# Patient Record
Sex: Female | Born: 1935 | Race: White | Hispanic: No | Smoking: Never smoker
Health system: Southern US, Community
[De-identification: ages and names within clinical notes are randomized; demographics above are authoritative.]

## PROBLEM LIST (undated history)

## (undated) DIAGNOSIS — G20 Parkinson's disease: Secondary | ICD-10-CM

## (undated) DIAGNOSIS — K219 Gastro-esophageal reflux disease without esophagitis: Secondary | ICD-10-CM

## (undated) DIAGNOSIS — K146 Glossodynia: Secondary | ICD-10-CM

## (undated) DIAGNOSIS — K3184 Gastroparesis: Secondary | ICD-10-CM

## (undated) DIAGNOSIS — I639 Cerebral infarction, unspecified: Secondary | ICD-10-CM

## (undated) DIAGNOSIS — K279 Peptic ulcer, site unspecified, unspecified as acute or chronic, without hemorrhage or perforation: Secondary | ICD-10-CM

## (undated) HISTORY — PX: TONSILLECTOMY: SUR1361

## (undated) HISTORY — PX: COLONOSCOPY: SHX174

## (undated) HISTORY — PX: APPENDECTOMY: SHX54

## (undated) HISTORY — DX: Gastro-esophageal reflux disease without esophagitis: K21.9

## (undated) HISTORY — PX: UPPER GASTROINTESTINAL ENDOSCOPY: SHX188

## (undated) HISTORY — DX: Gastroparesis: K31.84

---

## 2005-03-24 ENCOUNTER — Ambulatory Visit: Payer: Self-pay | Admitting: Cardiology

## 2005-04-01 ENCOUNTER — Ambulatory Visit: Payer: Self-pay | Admitting: Cardiology

## 2006-07-27 ENCOUNTER — Inpatient Hospital Stay (HOSPITAL_COMMUNITY): Admission: AD | Admit: 2006-07-27 | Discharge: 2006-07-28 | Payer: Self-pay | Admitting: Emergency Medicine

## 2006-07-28 ENCOUNTER — Ambulatory Visit: Payer: Self-pay | Admitting: Gastroenterology

## 2009-12-23 ENCOUNTER — Ambulatory Visit: Payer: Self-pay | Admitting: Internal Medicine

## 2010-02-24 ENCOUNTER — Ambulatory Visit (INDEPENDENT_AMBULATORY_CARE_PROVIDER_SITE_OTHER): Payer: Medicare Other | Admitting: Internal Medicine

## 2010-02-24 DIAGNOSIS — K3184 Gastroparesis: Secondary | ICD-10-CM

## 2010-02-24 DIAGNOSIS — R112 Nausea with vomiting, unspecified: Secondary | ICD-10-CM

## 2010-05-26 NOTE — H&P (Signed)
NAME:  Michele Goodwin, Michele Goodwin              ACCOUNT NO.:  [id number]   MEDICAL RECORD NO.:  [id number]          PATIENT TYPE:  INP   LOCATION:  A227                          FACILITY:  [facility]   PHYSICIAN:  Marcello Moores, MD   DATE OF BIRTH:  23-Aug-1917   DATE OF ADMISSION:  07/27/2006  DATE OF DISCHARGE:  LH                              HISTORY & PHYSICAL   PMD:  Her PMD is in [place].   CHIEF COMPLAINT:  Dizziness of 2 days' and dark stool of 3 days'  duration.   HISTORY OF PRESENT ILLNESS:  Michele Goodwin is an 77 year old female patient  with history of hypertension and a history of TIA, who presented with  the above complaints to the emergency room today morning.  As per the  patient and her son, she has noticed dark stool for the last 3 days,  only during bowel movement, and there was not any frank bleeding, and  she stated that since yesterday she was feeling dizzy and last night she  tried to go to bathroom and while she was returning from the past room,  she fell down and she had a bruise on her upper extremity and she had  noted also dark stool during her bowel movement this morning, and she  decided to come to the emergency room.  Otherwise, she stated that she  has mild stroke last year and she has hypertension, for which she takes  some medication, otherwise relatively she was in a good health.   REVIEW OF SYSTEMS:  She had not any pulmonary complaints.  She has not  any urinary complaints.  She has no fever or any cough, and she has no  headache except what she stated, dizziness.   ALLERGIES:  She is allergic to CODEINE, DEMEROL AND MORPHINE.   SOCIAL HISTORY:  She lives alone independently.  Her son lives nearby.  She denied smoking or alcohol use.   PAST MEDICAL HISTORY:  1. TIA.  2. Hypertension.  3. Episode of asthma bronchitis.   MEDICATIONS:  1. Meclizine 25 mg four times a day.  2. Plavix 75 mg once a day.  3. Aspirin 81 mg once a day.  4. Chlorthalidone 25 mg once a  day.   On physical examination, the patient is lying in the bed without any  respiratory distress.  She is fairly oriented.  VITAL SIGNS:  Blood pressure 100/52, temperature is 98, and pulse rate  is 104 and respiratory rate is 70 per minute, and saturation is 100%.  HEENT:  She has slight pallor, nonicteric sclerae.  Neck is supple.  CHEST:  Good air entry bilaterally.  CVS:  Slightly tachycardiac.  Otherwise, there is not any murmur.  Abdomen is soft.  No area of tenderness.  Normoactive bowel sounds.  EXTREMITIES:  She has some bruise on the left upper extremity, and there  is no pedal edema.  Peripheral pulse positive.  CNS:  She is alert and fairly oriented, and there is not any  neurological deficit.   LABS:  White blood cell 8.3 and hemoglobin is 7.3, hematocrit is 21  and  platelet count is 275, and MCV is 87% and RDW is 14.2.  CK-MB is 1.4 and  troponin is less than 0.05.  on the chemistry, sodium is 139, potassium  is 3.2, chloride is 108 and glucose is 99, BUN 75 and creatinine is 0.8,  calcium is 8.2.  PT is 13.2, INR is 1.   ASSESSMENT:  1. Painless gastrointestinal bleeding.  The patient presented with      dizziness and dark stool for the last few days and hemoglobin is      low and stool guaiac while in the emergency room was positive, and      will admit her and will transfuse her with at least 2 packed red      blood cells and will give her IV fluid and will consult      gastroenterology also for esophagogastroduodenoscopy and      colonoscopy.  2. Symptomatic anemia secondary to gastrointestinal bleeding.  The      patient has dizziness for the last couple of days and this is      probably related to her gastrointestinal bleed anemia, and the      management will be as #1.  3. Elevated BUN.  Probably this is related to the gastrointestinal      bleed, and we expect it to respond to the transfusion and when      gastrointestinal bleed improves, and will put her  also on low rate      of IV rehydration.  The patient will be given Protonix 40 mg      b.i.d., and we will put her on pneumatic compression for deep vein      thrombosis prophylaxis as well and will monitor her      hemoglobin/hematocrit every 6 hours and will manage accordingly and      will follow gastroenterology recommendation as well.  4. Transient ischemic attack, hypertension is stable.  For now I will      hold the Plavix and aspirin.      Marcello Moores, MD  Electronically Signed     MT/MEDQ  D:  07/27/2006  T:  07/28/2006  Job:  [id number]

## 2010-05-26 NOTE — Discharge Summary (Signed)
NAME:  Michele Goodwin, Michele Goodwin              ACCOUNT NO.:  [id number]   MEDICAL RECORD NO.:  [id number]          PATIENT TYPE:  INP   LOCATION:  A227                          FACILITY:  [facility]   PHYSICIAN:  Marcello Moores, MD   DATE OF BIRTH:  14-Jan-1917   DATE OF ADMISSION:  07/27/2006  DATE OF DISCHARGE:  07/17/2008LH                               DISCHARGE SUMMARY   PMD:  Dr. Sherril Croon in [place].   DIAGNOSES AT DISCHARGE:  1. Gastrointestinal bleed resolved status post EGD.  Found to gastric      and esophageal ulcerations.  2. History of transient ischemic attack.  3. Hypertension fairly controlled.   HOME MEDICATIONS:  1. ASA 81 mg which is in hold at least for one week in relation to the      bleeding.  2. Plavix 75 mg which is in hold related to bleeding at least for one      week.  3. Protonix 40 mg p.o. daily.  4. Meclizine 25 mg four times a day.  5. Chlorthalidone 25 mg once a day.  6. Prednisone.   Michele Goodwin is an 77 year old female patient with the above medical  problems who presents with dizziness and dark stools.  She was admitted  with GI bleed and symptomatic anemia, and she received 2 units of packed  red blood cells.  EGD was done by GI, Dr. Jena Gauss, and found ulcers but  she did not have any frank bleeding in the hospital.  She remained very  stable, and she was cleared to be discharged by GI to have follow-up  with them.  The patient is ready to go home.  As she had this bleeding  her aspirin and Plavix was advised to be on hold for one week, and she  will go to her PMD whether to restart them.  She was on prednisone  at  the end of tappering .  As per the patient she got it for asthma, and  that was also discontinued; otherwise, today she is very stable.   PHYSICAL EXAMINATION:  VITAL SIGNS:  Temperature 98, pulse 85,  respiratory rate 18, blood pressure 140/79.   LABORATORY DATA:  Today  she has  8.6, hemoglobin 10, hematocrit 29,  platelets 230.   Currently she is  not bleeding, and the patient and her son were advised  to have follow-up with PMD in the coming one week to repeat her  hemoglobin/hematocrit and to discuss about the medications.  She will  have follow up with GI in the coming 12 weeks.      Marcello Moores, MD  Electronically Signed     MT/MEDQ  D:  07/28/2006  T:  07/29/2006  Job:  [id number]

## 2010-05-26 NOTE — Consult Note (Signed)
NAME:  Michele Goodwin, Michele Goodwin              ACCOUNT NO.:  [id number]   MEDICAL RECORD NO.:  [id number]          PATIENT TYPE:  INP   LOCATION:  A227                          FACILITY:  [facility]   PHYSICIAN:  R. Roetta Sessions, M.D. DATE OF BIRTH:  08/13/1917   DATE OF CONSULTATION:  07/27/2006  DATE OF DISCHARGE:                                 CONSULTATION   PRIMARY CARE PHYSICIAN:  Dhruv Vyas.   REASON FOR CONSULTATION:  GI bleed.   HISTORY OF PRESENT ILLNESS:  This patient is an 77 year old female who  has profound anemia with a hemoglobin of 7.3.  Yesterday she began to  notice vertigo and presyncope along with significant weakness.  Around  6:00 last night she went to bed as she was so fatigued.  Around 2 a.m.  she went to the bathroom.  She fell and crawled back to her bed, went  back to sleep.  Around 6 a.m., she fell again and could not get up.  She  had urinated on herself.  She then had an episode of melena.  She  actually tells me she has had 2 to 3 days of melena at least twice a  day.  She previously had a history of constipation.  She denies any  abdominal pain.  She does have some nausea but denies any vomiting.  Denies any heartburn, indigestion, dysphagia, odynophagia, anorexia,  early satiety.  She has been on Plavix for several years given her  history of CVA.  She is on aspirin 81 mg daily and she started  prednisone last Thursday with history of asthma.   PAST MEDICAL/SURGICAL HISTORY:  1. Asthma.  2. Hypertension.  3. CVA.  4. Burning mouth syndrome.  5. She has history of adenomatous polyps, colonoscopy 3 to 4 years ago      by Dr. Cleotis Nipper was normal.  6. She has history of peptic ulcer disease found in [place] on      EGD some 30 years ago.  7. Appendicitis 3 years ago.   MEDICATIONS PRIOR TO ADMISSION:  1. Plavix 75 mg daily.  2. Aspirin 81 mg daily.  3. Metoprolol once daily.  4. Calcium and vitamin D daily.  5. Tegretol 12.5 mg daily.  6. Advair  b.i.d.  7. Prednisone Dosepak.  8. Multivitamin daily.  9. Meclizine 25 mg daily.   ALLERGIES:  CODEINE, MORPHINE and DEMEROL.  All cause nausea.   FAMILY HISTORY:  Positive for mother with colon cancer, diagnosed at age  20.   SOCIAL HISTORY:  Ms. Riche lives alone.  She is a widow.  She is a  retired [employer] operational officer.  She denies any tobacco, alcohol or drug use.  She  has 1 healthy grandson.   REVIEW OF SYSTEMS:  See HPI.  Otherwise negative.   PHYSICAL EXAMINATION:  VITAL SIGNS:  Temperature is 97 degrees.  Blood  pressure is 122/84, pulse 89, respirations 20.  GENERAL:  She is an elderly, pale-appearing Caucasian female in no acute  distress.  HEENT:  Pupils are clear.  Anicteric sclerae.  Conjunctivae are pale.  Oropharynx is pink and moist  without lesions.  NECK:  Supple without mass, thyromegaly.  CHEST:  Heart has a regular rate and rhythm, normal S1 and S2.  ABDOMEN:  Positive bowel sounds times 4.  No bruit is auscultated.  Soft, nontender, nondistended without palpable mass or  hepatosplenomegaly.  She does have palpable fullness to the suprapubic  area.  There is no rebound tenderness or guarding.  No  hepatosplenomegaly.  EXTREMITIES:  Without clubbing or edema bilaterally.  SKIN:  Pale, warm and dry without any rash or jaundice.   LABORATORY STUDIES:  White blood count is 8.6, hematocrit 21.4,  platelets 275, INR 1.  Calcium 8.2, sodium 139, potassium 3.2, chloride  108, CO2 26, BUN 75, creatinine 0.86, glucose 99.  Total bilirubin is  0.4, alkaline phosphatase 30, AST 26, ALT 24, total protein 5.3, albumin  3.2 and urinalysis is positive for moderate blood.   IMPRESSION:  Michele Goodwin is an 77 year old female with profound anemia and  melena as well as syncope.  She was on Plavix, aspirin and prednisone at  home, placing her at very high risk for gastrointestinal bleed.  I  suspect she has bleeding peptic ulcer disease as the culprit and will  require  esophagogastroduodenoscopy.  Other less likely etiology includes  small-bowel arteriovenous malformations.   PLAN:  1. Would avoid aspirin, Plavix and prednisone for now.  2. B.i.d. Protonix.  3. EGD by Dr. Jena Gauss to be done now.  I have discussed the procedure      including risks and benefits to include but not limited to      bleeding, infection, perforation or drug reaction.  She agrees with      this plan.  Consent will be      obtained.  Dr. Jena Gauss is aware of her Demerol reaction of nausea.  4. Further recommendations pending procedure.   We would like to thank the [facility] for allowing [place] to  participate in the care of Ms. Befort.      Lorenza Burton, N.P.      Jonathon Bellows, M.D.  Electronically Signed    KJ/MEDQ  D:  07/27/2006  T:  07/27/2006  Job:  [id number]   cc:   Doreen Beam  Fax: [phone]

## 2010-05-26 NOTE — Op Note (Signed)
NAME:  Michele, Goodwin              ACCOUNT NO.:  [id number]   MEDICAL RECORD NO.:  [id number]          PATIENT TYPE:  INP   LOCATION:  A227                          FACILITY:  [facility]   PHYSICIAN:  R. Roetta Sessions, M.D. DATE OF BIRTH:  Apr 04, 1917   DATE OF PROCEDURE:  07/27/2006  DATE OF DISCHARGE:                               OPERATIVE REPORT   INDICATIONS FOR PROCEDURE:  The patient is an 77 year old Caucasian  female with recent melena and progressive weakness.  She presented with  a profound anemia for which she is being transfused.  She remains  hemodynamically stable.  She has been taking aspirin, Plavix and  prednisone recently.  Her last colonoscopy has been more than five years  ago by Dr. Marcell Anger (personal history of polyps and positive family  history colon cancer first relative).  EGD is now being done.  This  approach has discussed with the patient, the patient's son. Potential  risks, benefits and alternatives have been reviewed, questions answered.  She is agreeable.  Please see documentation in the medical record.   PROCEDURE NOTE:  O2 saturation, blood pressure, pulse and respirations  monitored throughout the entire procedure.   CONSCIOUS SEDATION:  Fentanyl 50 mcg IV, Versed 2 mg IV in a single  dose.  Cetacaine spray topical pharyngeal anesthesia.   FINDINGS:  Examination of the tubular esophagus revealed noncritical  Schatzki's ring otherwise the esophageal mucosa appeared normal.  EG  junction easily traversed.  Stomach:  The gastric cavity was empty and insufflated well with air.  Thorough examination of gastric mucosa and retroflex view of proximal  stomach was undertaken.  The patient had a small hiatal hernia.  The  patient had multiple deep erosions and ulcerations of the antrum and  body of the stomach.  The largest ulcer was approximately 1.5 to 2 cm x  0.5 cm up on the body seen best retroflexed.  All these lesions had a  clean base.  This did not  appear to be an infiltrating process.  Please  see multiple photos taken.  Pylorus was patent, easily traversed.  Examination bulb and second portion revealed no abnormalities.   THERAPEUTIC/DIAGNOSTIC MANEUVERS PERFORMED:  None.   The patient tolerated the procedure well, was reacted in endoscopy.   IMPRESSION:  Noncritical Schatzki's ring otherwise, normal esophagus,  not manipulated, small hiatal hernia, multiple erosions and gastric  ulcerations as described above, likely a benign process, otherwise  normal gastric mucosa, patient pylorus, normal D1 and D2.   I suspect today's findings are secondary to NSAID/Plavix gastropathy.  This nice lady could easily have similar lesions more distally in her  small bowel as well.   RECOMMENDATIONS:  1. Clear liquid diet today and check CBC and H pylori serologies      tomorrow morning.  2. Avoid aspirin, Plavix for the time-being.  3. Repeat EGD in 12 weeks will get medical records regarding her last      colonoscopy to see that she is up-to-date.   Dr. Cira Servant will begin seeing 07/28/2006 in my absence.      Michele Goodwin,  M.D.  Electronically Signed     RMR/MEDQ  D:  07/27/2006  T:  07/28/2006  Job:  [id number]

## 2010-05-26 NOTE — Discharge Summary (Signed)
NAME:  DENIKA, KRONE              ACCOUNT NO.:  [id number]   MEDICAL RECORD NO.:  [id number]          PATIENT TYPE:  INP   LOCATION:  A227                          FACILITY:  [facility]   PHYSICIAN:  Marcello Moores, MD   DATE OF BIRTH:  June 23, 1917   DATE OF ADMISSION:  07/27/2006  DATE OF DISCHARGE:  07/17/2008LH                               DISCHARGE SUMMARY   DISCHARGE DIAGNOSIS:  Painless gastrointestinal bleed, resolved.      Marcello Moores, MD  Electronically Signed     MT/MEDQ  D:  07/28/2006  T:  07/29/2006  Job:  [id number]

## 2010-08-17 ENCOUNTER — Encounter (INDEPENDENT_AMBULATORY_CARE_PROVIDER_SITE_OTHER): Payer: Self-pay | Admitting: *Deleted

## 2010-10-26 LAB — CBC
HCT: 21.4 — ABNORMAL LOW
HCT: 26.4 — ABNORMAL LOW
Hemoglobin: 7.3 — CL
Hemoglobin: 9 — ABNORMAL LOW
MCHC: 34.1
MCHC: 34.2
MCHC: 34.3
MCHC: 34.6
MCV: 86.9
MCV: 87.2
MCV: 87.4
MCV: 88.2
Platelets: 223
Platelets: 233
Platelets: 275
RBC: 2.45 — ABNORMAL LOW
RBC: 2.94 — ABNORMAL LOW
RBC: 3.02 — ABNORMAL LOW
RBC: 3.1 — ABNORMAL LOW
RBC: 3.3 — ABNORMAL LOW
RDW: 14.2 — ABNORMAL HIGH
RDW: 14.5 — ABNORMAL HIGH
WBC: 7.6
WBC: 7.8
WBC: 8.2
WBC: 8.6
WBC: 8.6

## 2010-10-26 LAB — TYPE AND SCREEN
ABO/RH(D): A POS
Antibody Screen: NEGATIVE

## 2010-10-26 LAB — HEMOGLOBIN AND HEMATOCRIT, BLOOD: HCT: 26.8 — ABNORMAL LOW

## 2010-10-26 LAB — DIFFERENTIAL
Basophils Absolute: 0
Basophils Absolute: 0
Basophils Relative: 0
Basophils Relative: 1
Basophils Relative: 1
Basophils Relative: 1
Eosinophils Absolute: 0
Eosinophils Absolute: 0.1
Eosinophils Absolute: 0.1
Eosinophils Absolute: 0.1
Eosinophils Absolute: 0.1
Eosinophils Relative: 0
Lymphocytes Relative: 30
Lymphocytes Relative: 32
Lymphocytes Relative: 43
Lymphs Abs: 2.6
Lymphs Abs: 2.7
Lymphs Abs: 3.2
Lymphs Abs: 3.5 — ABNORMAL HIGH
Monocytes Absolute: 0.4
Monocytes Absolute: 0.4
Monocytes Absolute: 0.4
Monocytes Relative: 5
Monocytes Relative: 5
Monocytes Relative: 5
Monocytes Relative: 5
Monocytes Relative: 5
Neutro Abs: 4.2
Neutro Abs: 5.3
Neutro Abs: 5.4
Neutrophils Relative %: 51
Neutrophils Relative %: 52
Neutrophils Relative %: 56
Neutrophils Relative %: 57
Neutrophils Relative %: 62
Neutrophils Relative %: 63

## 2010-10-26 LAB — APTT: aPTT: <20 — ABNORMAL LOW

## 2010-10-26 LAB — COMPREHENSIVE METABOLIC PANEL
BUN: 75 — ABNORMAL HIGH
CO2: 26
Calcium: 8.2 — ABNORMAL LOW
GFR calc non Af Amer: >60
Glucose, Bld: 99
Total Protein: 5.3 — ABNORMAL LOW

## 2010-10-26 LAB — URINALYSIS, ROUTINE W REFLEX MICROSCOPIC
Bilirubin Urine: NEGATIVE
Glucose, UA: NEGATIVE
Ketones, ur: NEGATIVE
Leukocytes, UA: NEGATIVE
Nitrite: NEGATIVE
Protein, ur: NEGATIVE
Specific Gravity, Urine: 1.01
Urobilinogen, UA: 0.2
pH: 6

## 2010-10-26 LAB — COMPREHENSIVE METABOLIC PANEL WITH GFR
ALT: 24
AST: 26
Albumin: 3.2 — ABNORMAL LOW
Alkaline Phosphatase: 30 — ABNORMAL LOW
Chloride: 108
Creatinine, Ser: 0.86
GFR calc Af Amer: >60
Potassium: 3.2 — ABNORMAL LOW
Sodium: 139
Total Bilirubin: 0.4

## 2010-10-26 LAB — PROTIME-INR
INR: 1
Prothrombin Time: 13.6

## 2010-10-26 LAB — POCT CARDIAC MARKERS
CKMB, poc: 1.8
Myoglobin, poc: 182
Operator id: 264761
Troponin i, poc: <0.05

## 2010-10-26 LAB — URINE MICROSCOPIC-ADD ON

## 2010-10-26 LAB — ABO/RH: ABO/RH(D): A POS

## 2010-10-27 ENCOUNTER — Ambulatory Visit (INDEPENDENT_AMBULATORY_CARE_PROVIDER_SITE_OTHER): Payer: Medicare Other | Admitting: Internal Medicine

## 2011-02-23 ENCOUNTER — Ambulatory Visit (INDEPENDENT_AMBULATORY_CARE_PROVIDER_SITE_OTHER): Payer: Medicare Other | Admitting: Internal Medicine

## 2011-02-23 ENCOUNTER — Encounter (INDEPENDENT_AMBULATORY_CARE_PROVIDER_SITE_OTHER): Payer: Self-pay | Admitting: Internal Medicine

## 2011-02-23 VITALS — BP 128/72 | HR 74 | Temp 97.1°F | Resp 12 | Ht 62.0 in | Wt 118.2 lb

## 2011-02-23 DIAGNOSIS — Z8711 Personal history of peptic ulcer disease: Secondary | ICD-10-CM | POA: Insufficient documentation

## 2011-02-23 DIAGNOSIS — R112 Nausea with vomiting, unspecified: Secondary | ICD-10-CM

## 2011-02-23 DIAGNOSIS — K219 Gastro-esophageal reflux disease without esophagitis: Secondary | ICD-10-CM | POA: Insufficient documentation

## 2011-02-23 MED ORDER — ONDANSETRON HCL 4 MG PO TABS: 4.0000 mg | ORAL_TABLET | Freq: Every day | ORAL | Status: AC | PRN

## 2011-02-23 NOTE — Patient Instructions (Addendum)
Please note new medicine for nausea vomiting is Zofran/ondansetron. Promethazine discontinued. Please keep written record of nausea vomiting episodes and call with progress report in 3 months. Office will inform you when domperidone would be available once again.

## 2011-02-23 NOTE — Progress Notes (Signed)
Presenting complaint; Followup for nausea and vomiting. Subjective: Michele Goodwin is 78 year old Caucasian female who is here for scheduled visit accompanied by her son Annette Stable. She has history of peptic ulcer disease and recurrent nausea and vomiting and was last seen one year ago. She was feeling so well that she decided not to come for followup visit in August 2012. She had been on low-dose domperidone which she ran out about 2 months ago. She did fine until 3 weeks ago when she got sick. She was at [employer] but only had a spoonful of food. She vomited soon after leaving the restaurant and 2 more times at night. She does not recall that she had hematemesis melena headache fever or chills. She also did not experience abdominal pain. She was seen by Dr. Sherril Croon and given prescription for promethazine. She hasn't had any more episodes of nausea and vomiting. No one else in the family experienced similar illness. She has occasional occasional hematochezia when she is constipated and she is using cream which helps. She wants to know if she should go back on domperidone. Her appetite is fair. She has not lost any weight since her last visit one year ago.  Current Medications: Current Outpatient Prescriptions  Medication Sig Dispense Refill  . Aspirin Buff, Al Hyd-Mg Hyd, (ARTHRITIS PAIN FORMULA PO) Take by mouth as needed.      . [place]-Glucosamine-Vit D (OSTEO BI-FLEX/5-LOXIN ADVANCED) TABS Take by mouth 2 (two) times daily.      . Calcium Carbonate-Vitamin D (CALCIUM 600 + D PO) Take by mouth 2 (two) times daily.      . carbamazepine (TEGRETOL XR) 100 MG 12 hr tablet Take 100 mg by mouth 2 (two) times daily.      . chlorthalidone (HYGROTON) 25 MG tablet Take 25 mg by mouth daily. 1/2 tablet daily per patient      . clopidogrel (PLAVIX) 75 MG tablet Take 75 mg by mouth daily.      . fesoterodine (TOVIAZ) 4 MG TB24 Take 4 mg by mouth daily.      . meclizine (ANTIVERT) 25 MG tablet Take 25 mg by mouth as needed.       . pantoprazole (PROTONIX) 40 MG tablet Take 40 mg by mouth daily.      . promethazine (PHENERGAN) 25 MG tablet Take 25 mg by mouth every 6 (six) hours as needed. 1/2 to 1 tablet as needed for nausea        Objective: Blood pressure 128/72, pulse 74, temperature 97.1 F (36.2 C), temperature source Oral, resp. rate 12, height 5\' 2"  (1.575 m), weight 118 lb 3.2 oz (53.615 kg). Patient is alert and appears to be anxious. She carefully move from chair to examination table with some assistance. Conjunctiva is pink. Sclera is nonicteric Oropharyngeal mucosa is normal. No neck masses or thyromegaly noted. Cardiac exam with regular rhythm normal S1 and S2. No murmur or gallop noted. Lungs are clear to auscultation. Abdomen is flat. Bowel sounds are normal. Abdomen is soft and nontender and no succussion splash noted in epigastric region. No LE edema or clubbing noted.  Assessment: Recent episode of nausea and vomiting which occurred 3 weeks ago. She went 1 year without any problems. I suspect this was an isolated event and not part and parcel of her chronic symptoms. Will monitor her clinical course before domperidone is reinstituted. This medication is not currently available and none in the process of registering with FDA or use this medication. I doubt that  peptic ulcer disease has relapsed. She is on pantoprazole which she should continue.    Plan: Patient advised to discontinue promethazine as potential for side effects is too high in her age group. Prescription given for Zofran 4 mg 4 mg twice a day when necessary. Patient advised to keep a symptom diary and call us with a progress report in 3 months or earlier if necessary We will let her know when domperidone is available.

## 2011-10-28 DIAGNOSIS — R109 Unspecified abdominal pain: Secondary | ICD-10-CM

## 2012-07-12 DIAGNOSIS — M6281 Muscle weakness (generalized): Secondary | ICD-10-CM

## 2014-08-14 ENCOUNTER — Emergency Department (HOSPITAL_COMMUNITY): Payer: Medicare Other

## 2014-08-14 ENCOUNTER — Inpatient Hospital Stay (HOSPITAL_COMMUNITY)
Admission: EM | Admit: 2014-08-14 | Discharge: 2014-08-21 | DRG: 481 | Disposition: A | Discharge status: Skilled Nursing Facility | Payer: Medicare Other | Attending: Internal Medicine | Admitting: Internal Medicine

## 2014-08-14 ENCOUNTER — Encounter (HOSPITAL_COMMUNITY): Payer: Self-pay

## 2014-08-14 DIAGNOSIS — S12100K Unspecified displaced fracture of second cervical vertebra, subsequent encounter for fracture with nonunion: Secondary | ICD-10-CM

## 2014-08-14 DIAGNOSIS — Z79899 Other long term (current) drug therapy: Secondary | ICD-10-CM | POA: Diagnosis not present

## 2014-08-14 DIAGNOSIS — G20 Parkinson's disease: Secondary | ICD-10-CM | POA: Diagnosis present

## 2014-08-14 DIAGNOSIS — Z7902 Long term (current) use of antithrombotics/antiplatelets: Secondary | ICD-10-CM | POA: Diagnosis not present

## 2014-08-14 DIAGNOSIS — S12110A Anterior displaced Type II dens fracture, initial encounter for closed fracture: Secondary | ICD-10-CM | POA: Diagnosis present

## 2014-08-14 DIAGNOSIS — Z7982 Long term (current) use of aspirin: Secondary | ICD-10-CM | POA: Diagnosis not present

## 2014-08-14 DIAGNOSIS — Z66 Do not resuscitate: Secondary | ICD-10-CM | POA: Diagnosis present

## 2014-08-14 DIAGNOSIS — S0101XA Laceration without foreign body of scalp, initial encounter: Secondary | ICD-10-CM | POA: Diagnosis present

## 2014-08-14 DIAGNOSIS — K146 Glossodynia: Secondary | ICD-10-CM | POA: Diagnosis not present

## 2014-08-14 DIAGNOSIS — S12100A Unspecified displaced fracture of second cervical vertebra, initial encounter for closed fracture: Secondary | ICD-10-CM | POA: Diagnosis present

## 2014-08-14 DIAGNOSIS — Z8673 Personal history of transient ischemic attack (TIA), and cerebral infarction without residual deficits: Secondary | ICD-10-CM

## 2014-08-14 DIAGNOSIS — F0390 Unspecified dementia without behavioral disturbance: Secondary | ICD-10-CM | POA: Diagnosis present

## 2014-08-14 DIAGNOSIS — Z791 Long term (current) use of non-steroidal anti-inflammatories (NSAID): Secondary | ICD-10-CM | POA: Diagnosis not present

## 2014-08-14 DIAGNOSIS — S12100S Unspecified displaced fracture of second cervical vertebra, sequela: Secondary | ICD-10-CM | POA: Diagnosis not present

## 2014-08-14 DIAGNOSIS — Z23 Encounter for immunization: Secondary | ICD-10-CM | POA: Diagnosis not present

## 2014-08-14 DIAGNOSIS — S7011XA Contusion of right thigh, initial encounter: Secondary | ICD-10-CM | POA: Diagnosis present

## 2014-08-14 DIAGNOSIS — Y92129 Unspecified place in nursing home as the place of occurrence of the external cause: Secondary | ICD-10-CM | POA: Diagnosis not present

## 2014-08-14 DIAGNOSIS — S7010XA Contusion of unspecified thigh, initial encounter: Secondary | ICD-10-CM | POA: Diagnosis present

## 2014-08-14 DIAGNOSIS — S72141A Displaced intertrochanteric fracture of right femur, initial encounter for closed fracture: Principal | ICD-10-CM | POA: Diagnosis present

## 2014-08-14 DIAGNOSIS — S7011XS Contusion of right thigh, sequela: Secondary | ICD-10-CM

## 2014-08-14 DIAGNOSIS — S72001A Fracture of unspecified part of neck of right femur, initial encounter for closed fracture: Secondary | ICD-10-CM | POA: Diagnosis not present

## 2014-08-14 DIAGNOSIS — S12110S Anterior displaced Type II dens fracture, sequela: Secondary | ICD-10-CM

## 2014-08-14 DIAGNOSIS — S129XXA Fracture of neck, unspecified, initial encounter: Secondary | ICD-10-CM | POA: Diagnosis not present

## 2014-08-14 DIAGNOSIS — D62 Acute posthemorrhagic anemia: Secondary | ICD-10-CM | POA: Diagnosis not present

## 2014-08-14 DIAGNOSIS — S7012XS Contusion of left thigh, sequela: Secondary | ICD-10-CM

## 2014-08-14 DIAGNOSIS — K3184 Gastroparesis: Secondary | ICD-10-CM | POA: Diagnosis not present

## 2014-08-14 DIAGNOSIS — M25551 Pain in right hip: Secondary | ICD-10-CM | POA: Diagnosis present

## 2014-08-14 DIAGNOSIS — K219 Gastro-esophageal reflux disease without esophagitis: Secondary | ICD-10-CM | POA: Diagnosis present

## 2014-08-14 DIAGNOSIS — W19XXXA Unspecified fall, initial encounter: Secondary | ICD-10-CM | POA: Diagnosis present

## 2014-08-14 DIAGNOSIS — Z885 Allergy status to narcotic agent status: Secondary | ICD-10-CM | POA: Diagnosis not present

## 2014-08-14 DIAGNOSIS — Z419 Encounter for procedure for purposes other than remedying health state, unspecified: Secondary | ICD-10-CM

## 2014-08-14 HISTORY — DX: Cerebral infarction, unspecified: I63.9

## 2014-08-14 HISTORY — DX: Glossodynia: K14.6

## 2014-08-14 HISTORY — DX: Parkinson's disease: G20

## 2014-08-14 HISTORY — DX: Peptic ulcer, site unspecified, unspecified as acute or chronic, without hemorrhage or perforation: K27.9

## 2014-08-14 LAB — CBC WITH DIFFERENTIAL/PLATELET
BASOS ABS: 0.1 10*3/uL (ref 0.0–0.1)
Basophils Relative: 0 % (ref 0–1)
EOS ABS: 0.3 10*3/uL (ref 0.0–0.7)
Eosinophils Relative: 2 % (ref 0–5)
HCT: 33.2 % — ABNORMAL LOW (ref 36.0–46.0)
HEMOGLOBIN: 10.8 g/dL — AB (ref 12.0–15.0)
Lymphocytes Relative: 11 % — ABNORMAL LOW (ref 12–46)
Lymphs Abs: 1.5 10*3/uL (ref 0.7–4.0)
MCH: 28.1 pg (ref 26.0–34.0)
MCHC: 32.5 g/dL (ref 30.0–36.0)
MCV: 86.5 fL (ref 78.0–100.0)
Monocytes Absolute: 0.9 10*3/uL (ref 0.1–1.0)
Monocytes Relative: 6 % (ref 3–12)
NEUTROS PCT: 81 % — AB (ref 43–77)
Neutro Abs: 11 10*3/uL — ABNORMAL HIGH (ref 1.7–7.7)
PLATELETS: 378 10*3/uL (ref 150–400)
RBC: 3.84 MIL/uL — ABNORMAL LOW (ref 3.87–5.11)
RDW: 13.7 % (ref 11.5–15.5)
WBC: 13.8 10*3/uL — ABNORMAL HIGH (ref 4.0–10.5)

## 2014-08-14 LAB — BASIC METABOLIC PANEL
Anion gap: 10 (ref 5–15)
BUN: 27 mg/dL — ABNORMAL HIGH (ref 6–20)
CO2: 26 mmol/L (ref 22–32)
Calcium: 8.5 mg/dL — ABNORMAL LOW (ref 8.9–10.3)
Chloride: 98 mmol/L — ABNORMAL LOW (ref 101–111)
Creatinine, Ser: 1.01 mg/dL — ABNORMAL HIGH (ref 0.44–1.00)
GFR calc Af Amer: 53 mL/min — ABNORMAL LOW (ref >60)
GFR, EST NON AFRICAN AMERICAN: 46 mL/min — AB (ref >60)
Glucose, Bld: 126 mg/dL — ABNORMAL HIGH (ref 65–99)
Potassium: 4.4 mmol/L (ref 3.5–5.1)
Sodium: 134 mmol/L — ABNORMAL LOW (ref 135–145)

## 2014-08-14 LAB — PROTIME-INR
INR: 1.04 (ref 0.00–1.49)
Prothrombin Time: 13.8 seconds (ref 11.6–15.2)

## 2014-08-14 MED ORDER — ONDANSETRON HCL 4 MG/2ML IJ SOLN
4.0000 mg | Freq: Once | INTRAMUSCULAR | Status: AC
  Administered 2014-08-14: 4 mg via INTRAVENOUS

## 2014-08-14 MED ORDER — ONDANSETRON HCL 4 MG/2ML IJ SOLN: 4.0000 mg | Freq: Once | INTRAMUSCULAR | Status: DC

## 2014-08-14 MED ORDER — ONDANSETRON HCL 4 MG/2ML IJ SOLN
INTRAMUSCULAR | Status: AC
  Filled 2014-08-14: qty 2

## 2014-08-14 MED ORDER — FENTANYL CITRATE (PF) 100 MCG/2ML IJ SOLN
50.0000 ug | Freq: Once | INTRAMUSCULAR | Status: AC
  Administered 2014-08-14: 50 ug via INTRAVENOUS

## 2014-08-14 MED ORDER — LIDOCAINE-EPINEPHRINE (PF) 1 %-1:200000 IJ SOLN
20.0000 mL | Freq: Once | INTRAMUSCULAR | Status: AC
  Administered 2014-08-14: 20 mL
  Filled 2014-08-14: qty 10

## 2014-08-14 MED ORDER — TETANUS-DIPHTH-ACELL PERTUSSIS 5-2.5-18.5 LF-MCG/0.5 IM SUSP
0.5000 mL | Freq: Once | INTRAMUSCULAR | Status: AC
  Administered 2014-08-14: 0.5 mL via INTRAMUSCULAR
  Filled 2014-08-14: qty 0.5

## 2014-08-14 MED ORDER — FENTANYL CITRATE (PF) 100 MCG/2ML IJ SOLN
INTRAMUSCULAR | Status: AC
  Administered 2014-08-14: 50 ug via INTRAVENOUS
  Filled 2014-08-14: qty 2

## 2014-08-14 NOTE — ED Notes (Signed)
Pt received 10 mg of morphine and 4 mg of zofran en route

## 2014-08-14 NOTE — ED Notes (Signed)
Pt is a resident of [facility] where she apparently fell, having pain to right hip with rotation and shortening per ems

## 2014-08-14 NOTE — ED Provider Notes (Signed)
CSN: [id number]     Arrival date & time 08/14/14  1951 History   First MD Initiated Contact with Patient 08/14/14 1954     Chief Complaint  Patient presents with  . Fall  . Hip Pain     (Consider location/radiation/quality/duration/timing/severity/associated sxs/prior Treatment) HPI Comments: Level 5 caveat for dementia. Patient from nursing facility with apparent fall and right hip pain. Also has abrasion and laceration to right scalp. She is oriented 1. She does not recall the fall. She takes aspirin and Plavix. Right hip is shortened and externally rotated. She is lying on her left side in the fetal position. She denies any head, neck, back, chest or abdominal pain. She denies any dizziness or lightheadedness.  Patient is a 81 y.o. female presenting with hip pain. The history is provided by the patient and the EMS personnel. The history is limited by the condition of the patient.  Hip Pain    Past Medical History  Diagnosis Date  . GERD (gastroesophageal reflux disease)   . Nausea & vomiting   . Gastroparesis    Past Surgical History  Procedure Laterality Date  . Appendectomy    . Tonsillectomy    . Colonoscopy    . Upper gastrointestinal endoscopy     Family History  Problem Relation Age of Onset  . Colon cancer Mother   . Heart disease Father   . Healthy Son    History  Substance Use Topics  . Smoking status: Never Smoker   . Smokeless tobacco: Never Used  . Alcohol Use: No   OB History    No data available     Review of Systems  Unable to perform ROS: Dementia      Allergies  Codeine; Demerol; and Morphine and related  Home Medications   Prior to Admission medications   Medication Sig Start Date End Date Taking? Authorizing Provider  beclomethasone (BECONASE-AQ) 42 MCG/SPRAY nasal spray Place 2 sprays into both nostrils daily. Dose is for each nostril.   Yes Historical Provider, MD  carbamazepine (TEGRETOL XR) 100 MG 12 hr tablet Take 100 mg by mouth  2 (two) times daily.   Yes Historical Provider, MD  carbidopa-levodopa (SINEMET IR) 25-100 MG per tablet Take 1 tablet by mouth 4 (four) times daily.  07/31/14  Yes Historical Provider, MD  chlorthalidone (HYGROTON) 25 MG tablet Take 12.5 mg by mouth daily. 1/2 tablet daily per patient   Yes Historical Provider, MD  clopidogrel (PLAVIX) 75 MG tablet Take 75 mg by mouth daily.   Yes Historical Provider, MD  fesoterodine (TOVIAZ) 4 MG TB24 Take 4 mg by mouth daily.   Yes Historical Provider, MD  meclizine (ANTIVERT) 25 MG tablet Take 25 mg by mouth every 8 (eight) hours as needed for dizziness.    Yes Historical Provider, MD  naproxen sodium (ALEVE) 220 MG tablet Take 440 mg by mouth daily.   Yes Historical Provider, MD  pantoprazole (PROTONIX) 40 MG tablet Take 40 mg by mouth daily.   Yes Historical Provider, MD  PROAIR HFA 108 (90 BASE) MCG/ACT inhaler Inhale 1-2 puffs into the lungs every 6 (six) hours as needed for wheezing or shortness of breath.  07/29/14  Yes Historical Provider, MD   BP 139/83 mmHg  Pulse 91  Temp(Src) 97.8 F (36.6 C) (Oral)  Resp 20  Ht  (1.575 m)  Wt 120 lb (54.432 kg)  BMI 21.94 kg/m2  SpO2 97% Physical Exam  Constitutional: She is oriented to person, place,  and time. She appears well-developed and well-nourished. No distress.  HENT:  Head: Normocephalic.  Mouth/Throat: Oropharynx is clear and moist. No oropharyngeal exudate.  Abrasion with 2 cm laceration to right parietal scalp  Eyes: Conjunctivae and EOM are normal. Pupils are equal, round, and reactive to light.  Neck: Normal range of motion. Neck supple.  No C-spine tenderness  Cardiovascular: Normal rate, regular rhythm, normal heart sounds and intact distal pulses.   No murmur heard. Pulmonary/Chest: Effort normal and breath sounds normal. No respiratory distress.  Abdominal: Soft. There is no tenderness. There is no rebound and no guarding.  Musculoskeletal: She exhibits tenderness. She exhibits  no edema.  Right hip is shortened and externally rotated. Intact DP pulse.  Neurological: She is alert and oriented to person, place, and time. No cranial nerve deficit. She exhibits normal muscle tone. Coordination normal.  Oriented 1, 5/5 strength throughout, moving all extremities  Skin: Skin is warm.  Psychiatric: She has a normal mood and affect. Her behavior is normal.  Nursing note and vitals reviewed.   ED Course  LACERATION REPAIR Date/Time: 08/14/2014 11:16 PM Performed by: Glynn Octave Authorized by: Glynn Octave Consent: Verbal consent obtained. Risks and benefits: risks, benefits and alternatives were discussed Consent given by: patient and power of attorney Patient understanding: patient states understanding of the procedure being performed Patient consent: the patient's understanding of the procedure matches consent given Procedure consent: procedure consent matches procedure scheduled Relevant documents: relevant documents present and verified Test results: test results available and properly labeled Site marked: the operative site was marked Patient identity confirmed: provided demographic data and verbally with patient Time out: Immediately prior to procedure a "time out" was called to verify the correct patient, procedure, equipment, support staff and site/side marked as required. Body area: head/neck Location details: scalp Laceration length: 2 cm Tendon involvement: none Nerve involvement: none Vascular damage: no Anesthesia: local infiltration Local anesthetic: lidocaine 1% with epinephrine Anesthetic total: 4 ml Patient sedated: no Preparation: Patient was prepped and draped in the usual sterile fashion. Irrigation solution: saline Irrigation method: syringe Amount of cleaning: standard Debridement: none Skin closure: staples Number of sutures: 4 Technique: simple Approximation: close Approximation difficulty: complex Dressing: 4x4 sterile  gauze and gauze packing Patient tolerance: Patient tolerated the procedure well with no immediate complications   (including critical care time) Labs Review Labs Reviewed  CBC WITH DIFFERENTIAL/PLATELET - Abnormal; Notable for the following:    WBC 13.8 (*)    RBC 3.84 (*)    Hemoglobin 10.8 (*)    HCT 33.2 (*)    Neutrophils Relative % 81 (*)    Neutro Abs 11.0 (*)    Lymphocytes Relative 11 (*)    All other components within normal limits  BASIC METABOLIC PANEL - Abnormal; Notable for the following:    Sodium 134 (*)    Chloride 98 (*)    Glucose, Bld 126 (*)    BUN 27 (*)    Creatinine, Ser 1.01 (*)    Calcium 8.5 (*)    GFR calc non Af Amer 46 (*)    GFR calc Af Amer 53 (*)    All other components within normal limits  PROTIME-INR  CBC  BASIC METABOLIC PANEL    Imaging Review Dg Chest 1 View  08/14/2014   CLINICAL DATA:  Fall tonight with right hip pain.  EXAM: CHEST  1 VIEW  COMPARISON:  08/04/2014  FINDINGS: Lungs are adequately inflated without focal consolidation or effusion. No evidence  pneumothorax. Mild stable cardiomegaly. Calcified plaque over the aortic arch. Biphasic curvature of the thoracolumbar spine with degenerative changes throughout the spine. Moderate degenerative change of the shoulders.  IMPRESSION: No active disease.   Electronically Signed   By: Elberta Fortis M.D.   On: 08/14/2014 20:46   Ct Head Wo Contrast  08/14/2014   CLINICAL DATA:  Status post fall, with concern for head or cervical spine injury. Initial encounter.  EXAM: CT HEAD WITHOUT CONTRAST  CT CERVICAL SPINE WITHOUT CONTRAST  TECHNIQUE: Multidetector CT imaging of the head and cervical spine was performed following the standard protocol without intravenous contrast. Multiplanar CT image reconstructions of the cervical spine were also generated.  COMPARISON:  CT of the head performed 06/27/2014, CT of the cervical spine performed 02/26/2009, and MRI of the brain performed 04/03/2009  FINDINGS:  CT HEAD FINDINGS  There is no evidence of acute infarction, mass lesion, or intra- or extra-axial hemorrhage on CT.  Prominence of the ventricles and sulci reflects moderate cortical volume loss. Diffuse periventricular and subcortical white matter change likely reflects small vessel ischemic microangiopathy. Chronic encephalomalacia at the high frontal lobes bilaterally may reflect remote infarct or traumatic injury. Cerebellar atrophy is noted.  The brainstem and fourth ventricle are within normal limits. The basal ganglia are unremarkable in appearance. No mass effect or midline shift is seen.  There is no evidence of fracture; visualized osseous structures are unremarkable in appearance. The orbits are within normal limits. The paranasal sinuses and mastoid air cells are well-aerated. Mild soft tissue swelling is noted overlying the right temporoparietal calvarium.  CT CERVICAL SPINE FINDINGS  There is no evidence of acute fracture or subluxation. There appears to be a chronic displaced and angulated fracture of the base of the dens, with associated osseous erosions involving both the dens and the remainder of C2. This is new from 2011. There is chronic osseous fusion at C3-C6, with surrounding disc osteophyte complexes, and chronic disc space narrowing along the remainder of the cervical and upper thoracic spine. There is grade 2 anterolisthesis of C7 on T1, reflecting underlying facet disease. Prevertebral soft tissues are within normal limits.  The left mandibular condyle is only minimally visualized on this study, possibly reflecting positioning.  A small calcification is noted at the left thyroid lobe. The visualized lung apices are clear. Scattered calcification is noted at the carotid bifurcations bilaterally.  IMPRESSION: 1. No evidence of traumatic intracranial injury or fracture. 2. No evidence of acute fracture or subluxation along the cervical spine. 3. Apparent chronic displaced and angulated  fracture of the base of the dens, with associated osseous erosions involving both the dens and the remainder of C2, new from 2011. 4. Mild soft tissue swelling overlying the right temporal parietal calvarium. 5. Moderate cortical volume loss and diffuse small vessel ischemic microangiopathy. 6. Chronic encephalomalacia at the high frontal lobes bilaterally may reflect remote infarct or traumatic injury. 7. Chronic osseous fusion at C3-C6, with diffuse associated degenerative change along the cervical and upper thoracic spine. Chronic grade 2 anterolisthesis of C7 on T1 reflects underlying facet disease. 8. Scattered calcification at the carotid bifurcations bilaterally. Carotid ultrasound could be considered for further evaluation, when and as deemed clinically appropriate.   Electronically Signed   By: Roanna Raider M.D.   On: 08/14/2014 21:24   Ct Cervical Spine Wo Contrast  08/14/2014   CLINICAL DATA:  Status post fall, with concern for head or cervical spine injury. Initial encounter.  EXAM: CT HEAD  WITHOUT CONTRAST  CT CERVICAL SPINE WITHOUT CONTRAST  TECHNIQUE: Multidetector CT imaging of the head and cervical spine was performed following the standard protocol without intravenous contrast. Multiplanar CT image reconstructions of the cervical spine were also generated.  COMPARISON:  CT of the head performed 06/27/2014, CT of the cervical spine performed 02/26/2009, and MRI of the brain performed 04/03/2009  FINDINGS: CT HEAD FINDINGS  There is no evidence of acute infarction, mass lesion, or intra- or extra-axial hemorrhage on CT.  Prominence of the ventricles and sulci reflects moderate cortical volume loss. Diffuse periventricular and subcortical white matter change likely reflects small vessel ischemic microangiopathy. Chronic encephalomalacia at the high frontal lobes bilaterally may reflect remote infarct or traumatic injury. Cerebellar atrophy is noted.  The brainstem and fourth ventricle are within  normal limits. The basal ganglia are unremarkable in appearance. No mass effect or midline shift is seen.  There is no evidence of fracture; visualized osseous structures are unremarkable in appearance. The orbits are within normal limits. The paranasal sinuses and mastoid air cells are well-aerated. Mild soft tissue swelling is noted overlying the right temporoparietal calvarium.  CT CERVICAL SPINE FINDINGS  There is no evidence of acute fracture or subluxation. There appears to be a chronic displaced and angulated fracture of the base of the dens, with associated osseous erosions involving both the dens and the remainder of C2. This is new from 2011. There is chronic osseous fusion at C3-C6, with surrounding disc osteophyte complexes, and chronic disc space narrowing along the remainder of the cervical and upper thoracic spine. There is grade 2 anterolisthesis of C7 on T1, reflecting underlying facet disease. Prevertebral soft tissues are within normal limits.  The left mandibular condyle is only minimally visualized on this study, possibly reflecting positioning.  A small calcification is noted at the left thyroid lobe. The visualized lung apices are clear. Scattered calcification is noted at the carotid bifurcations bilaterally.  IMPRESSION: 1. No evidence of traumatic intracranial injury or fracture. 2. No evidence of acute fracture or subluxation along the cervical spine. 3. Apparent chronic displaced and angulated fracture of the base of the dens, with associated osseous erosions involving both the dens and the remainder of C2, new from 2011. 4. Mild soft tissue swelling overlying the right temporal parietal calvarium. 5. Moderate cortical volume loss and diffuse small vessel ischemic microangiopathy. 6. Chronic encephalomalacia at the high frontal lobes bilaterally may reflect remote infarct or traumatic injury. 7. Chronic osseous fusion at C3-C6, with diffuse associated degenerative change along the  cervical and upper thoracic spine. Chronic grade 2 anterolisthesis of C7 on T1 reflects underlying facet disease. 8. Scattered calcification at the carotid bifurcations bilaterally. Carotid ultrasound could be considered for further evaluation, when and as deemed clinically appropriate.   Electronically Signed   By: Roanna Raider M.D.   On: 08/14/2014 21:24   Dg Hip Unilat With Pelvis 2-3 Views Right  08/14/2014   CLINICAL DATA:  Fall at nursing facility 10 night. Right hip pain and visible deformity. Initial encounter.  EXAM: DG HIP (WITH OR WITHOUT PELVIS) 2-3V RIGHT  COMPARISON:  None.  FINDINGS: A comminuted intertrochanteric right hip fracture is seen, with medial angulation of the distal fracture fragment. No evidence of hip dislocation. No acetabular or pelvic fracture identified. Generalized osteopenia noted.  IMPRESSION: Comminuted intertrochanteric right hip fracture.   Electronically Signed   By: Myles Rosenthal M.D.   On: 08/14/2014 20:46     EKG Interpretation   Date/Time:  Wednesday August 14 2014 20:06:50 EDT Ventricular Rate:  80 PR Interval:  172 QRS Duration: 83 QT Interval:  384 QTC Calculation: 443 R Axis:   -18 Text Interpretation:  Sinus rhythm Borderline left axis deviation Probable  anteroseptal infarct, recent No significant change was found Confirmed by  Manus Gunning  MD, Caree Wolpert [phone]) on 08/14/2014 8:32:14 PM      MDM   Final diagnoses:  Fracture, intertrochanteric, right femur, closed, initial encounter  Dens fracture, sequela   Apparent mechanical fall with head injury and right hip pain. Unknown loss of consciousness.  X-ray shows right intertrochanteric hip fracture.  CT head is negative. CT C-spine shows likely subacute C2 fracture of the dens. Family does not report any previous neck injury she has had multiple falls.  Scalp laceration repaired. Tetanus updated. Hip fracture d/w Dr. Romeo Apple who recommends holding plavix and he will plan on surgery in  AM.  C spine fracture d/w Dr. Franky Macho.  He recommends MRI to evaluate age of fracture as it is likely subacute. He does not feel patient will be operative candidate.  Family is not interested in any neck surgery. Aspen collar remains in place.  Glynn Octave, MD 08/15/14 [phone]

## 2014-08-14 NOTE — H&P (Signed)
PCP:   Ignatius Specking., MD   Chief Complaint:  Fall  HPI: 81 year old female who   has a past medical history of GERD (gastroesophageal reflux disease); Nausea & vomiting; and Gastroparesis. patient resides at skilled facility and was brought to the hospital after a fall. Patient fell on the right hip and was found to have right hip fracture. Also CT C-spine showed chronic angulated C2 cervical spine fracture. Patient is unable to provide any history due to dementia and hearing loss. Patient denies any pain.  Allergies:   Allergies  Allergen Reactions  . Codeine   . Demerol   . Morphine And Related       Past Medical History  Diagnosis Date  . GERD (gastroesophageal reflux disease)   . Nausea & vomiting   . Gastroparesis     Past Surgical History  Procedure Laterality Date  . Appendectomy    . Tonsillectomy    . Colonoscopy    . Upper gastrointestinal endoscopy      Prior to Admission medications   Medication Sig Start Date End Date Taking? Authorizing Provider  beclomethasone (BECONASE-AQ) 42 MCG/SPRAY nasal spray Place 2 sprays into both nostrils daily. Dose is for each nostril.   Yes Historical Provider, MD  carbamazepine (TEGRETOL XR) 100 MG 12 hr tablet Take 100 mg by mouth 2 (two) times daily.   Yes Historical Provider, MD  carbidopa-levodopa (SINEMET IR) 25-100 MG per tablet Take 1 tablet by mouth 4 (four) times daily.  07/31/14  Yes Historical Provider, MD  chlorthalidone (HYGROTON) 25 MG tablet Take 12.5 mg by mouth daily. 1/2 tablet daily per patient   Yes Historical Provider, MD  clopidogrel (PLAVIX) 75 MG tablet Take 75 mg by mouth daily.   Yes Historical Provider, MD  fesoterodine (TOVIAZ) 4 MG TB24 Take 4 mg by mouth daily.   Yes Historical Provider, MD  meclizine (ANTIVERT) 25 MG tablet Take 25 mg by mouth every 8 (eight) hours as needed for dizziness.    Yes Historical Provider, MD  naproxen sodium (ALEVE) 220 MG tablet Take 440 mg by mouth daily.   Yes  Historical Provider, MD  pantoprazole (PROTONIX) 40 MG tablet Take 40 mg by mouth daily.   Yes Historical Provider, MD  PROAIR HFA 108 (90 BASE) MCG/ACT inhaler Inhale 1-2 puffs into the lungs every 6 (six) hours as needed for wheezing or shortness of breath.  07/29/14  Yes Historical Provider, MD    Social History:  reports that she has never smoked. She has never used smokeless tobacco. She reports that she does not drink alcohol or use illicit drugs.  Family History  Problem Relation Age of Onset  . Colon cancer Mother   . Heart disease Father   . Healthy Ewing Schlein Weights   08/14/14 1953 08/14/14 1958  Weight: 54.432 kg (120 lb) 54.432 kg (120 lb)      Review of Systems:  Unrolled to obtain due to underlying dementia and hearing loss   Physical Exam: Blood pressure 164/87, pulse 83, temperature 98.1 F (36.7 C), temperature source Oral, resp. rate 16, height 5\' 2"  (1.575 m), weight 54.432 kg (120 lb), SpO2 93 %. Constitutional:   Patient is a well-developed and well-nourished female in no acute distress and cooperative with exam. Head: Normocephalic and atraumatic Mouth: Mucus membranes moist Eyes: PERRL, EOMI, conjunctivae normal Neck: Supple, No Thyromegaly Cardiovascular: RRR, S1 normal, S2 normal Pulmonary/Chest: CTAB, no wheezes, rales, or rhonchi Abdominal: Soft. Non-tender, non-distended,  bowel sounds are normal, no masses, organomegaly, or guarding present.  Neurological: A&O x3, Strength is normal and symmetric bilaterally, cranial nerve II-XII are grossly intact, no focal motor deficit, sensory intact to light touch bilaterally.  Extremities : Right hip is tender to palpation  Labs on Admission:  Basic Metabolic Panel:  Recent Labs Lab 08/14/14 2042  NA 134*  K 4.4  CL 98*  CO2 26  GLUCOSE 126*  BUN 27*  CREATININE 1.01*  CALCIUM 8.5*   CBC:  Recent Labs Lab 08/14/14 2042  WBC 13.8*  NEUTROABS 11.0*  HGB 10.8*  HCT 33.2*  MCV 86.5  PLT  378    Radiological Exams on Admission: Dg Chest 1 View  08/14/2014   CLINICAL DATA:  Fall tonight with right hip pain.  EXAM: CHEST  1 VIEW  COMPARISON:  08/04/2014  FINDINGS: Lungs are adequately inflated without focal consolidation or effusion. No evidence pneumothorax. Mild stable cardiomegaly. Calcified plaque over the aortic arch. Biphasic curvature of the thoracolumbar spine with degenerative changes throughout the spine. Moderate degenerative change of the shoulders.  IMPRESSION: No active disease.   Electronically Signed   By: Elberta Fortis M.D.   On: 08/14/2014 20:46   Ct Head Wo Contrast  08/14/2014   CLINICAL DATA:  Status post fall, with concern for head or cervical spine injury. Initial encounter.  EXAM: CT HEAD WITHOUT CONTRAST  CT CERVICAL SPINE WITHOUT CONTRAST  TECHNIQUE: Multidetector CT imaging of the head and cervical spine was performed following the standard protocol without intravenous contrast. Multiplanar CT image reconstructions of the cervical spine were also generated.  COMPARISON:  CT of the head performed 06/27/2014, CT of the cervical spine performed 02/26/2009, and MRI of the brain performed 04/03/2009  FINDINGS: CT HEAD FINDINGS  There is no evidence of acute infarction, mass lesion, or intra- or extra-axial hemorrhage on CT.  Prominence of the ventricles and sulci reflects moderate cortical volume loss. Diffuse periventricular and subcortical white matter change likely reflects small vessel ischemic microangiopathy. Chronic encephalomalacia at the high frontal lobes bilaterally may reflect remote infarct or traumatic injury. Cerebellar atrophy is noted.  The brainstem and fourth ventricle are within normal limits. The basal ganglia are unremarkable in appearance. No mass effect or midline shift is seen.  There is no evidence of fracture; visualized osseous structures are unremarkable in appearance. The orbits are within normal limits. The paranasal sinuses and mastoid air  cells are well-aerated. Mild soft tissue swelling is noted overlying the right temporoparietal calvarium.  CT CERVICAL SPINE FINDINGS  There is no evidence of acute fracture or subluxation. There appears to be a chronic displaced and angulated fracture of the base of the dens, with associated osseous erosions involving both the dens and the remainder of C2. This is new from 2011. There is chronic osseous fusion at C3-C6, with surrounding disc osteophyte complexes, and chronic disc space narrowing along the remainder of the cervical and upper thoracic spine. There is grade 2 anterolisthesis of C7 on T1, reflecting underlying facet disease. Prevertebral soft tissues are within normal limits.  The left mandibular condyle is only minimally visualized on this study, possibly reflecting positioning.  A small calcification is noted at the left thyroid lobe. The visualized lung apices are clear. Scattered calcification is noted at the carotid bifurcations bilaterally.  IMPRESSION: 1. No evidence of traumatic intracranial injury or fracture. 2. No evidence of acute fracture or subluxation along the cervical spine. 3. Apparent chronic displaced and angulated fracture of the  base of the dens, with associated osseous erosions involving both the dens and the remainder of C2, new from 2011. 4. Mild soft tissue swelling overlying the right temporal parietal calvarium. 5. Moderate cortical volume loss and diffuse small vessel ischemic microangiopathy. 6. Chronic encephalomalacia at the high frontal lobes bilaterally may reflect remote infarct or traumatic injury. 7. Chronic osseous fusion at C3-C6, with diffuse associated degenerative change along the cervical and upper thoracic spine. Chronic grade 2 anterolisthesis of C7 on T1 reflects underlying facet disease. 8. Scattered calcification at the carotid bifurcations bilaterally. Carotid ultrasound could be considered for further evaluation, when and as deemed clinically  appropriate.   Electronically Signed   By: Roanna Raider M.D.   On: 08/14/2014 21:24   Ct Cervical Spine Wo Contrast  08/14/2014   CLINICAL DATA:  Status post fall, with concern for head or cervical spine injury. Initial encounter.  EXAM: CT HEAD WITHOUT CONTRAST  CT CERVICAL SPINE WITHOUT CONTRAST  TECHNIQUE: Multidetector CT imaging of the head and cervical spine was performed following the standard protocol without intravenous contrast. Multiplanar CT image reconstructions of the cervical spine were also generated.  COMPARISON:  CT of the head performed 06/27/2014, CT of the cervical spine performed 02/26/2009, and MRI of the brain performed 04/03/2009  FINDINGS: CT HEAD FINDINGS  There is no evidence of acute infarction, mass lesion, or intra- or extra-axial hemorrhage on CT.  Prominence of the ventricles and sulci reflects moderate cortical volume loss. Diffuse periventricular and subcortical white matter change likely reflects small vessel ischemic microangiopathy. Chronic encephalomalacia at the high frontal lobes bilaterally may reflect remote infarct or traumatic injury. Cerebellar atrophy is noted.  The brainstem and fourth ventricle are within normal limits. The basal ganglia are unremarkable in appearance. No mass effect or midline shift is seen.  There is no evidence of fracture; visualized osseous structures are unremarkable in appearance. The orbits are within normal limits. The paranasal sinuses and mastoid air cells are well-aerated. Mild soft tissue swelling is noted overlying the right temporoparietal calvarium.  CT CERVICAL SPINE FINDINGS  There is no evidence of acute fracture or subluxation. There appears to be a chronic displaced and angulated fracture of the base of the dens, with associated osseous erosions involving both the dens and the remainder of C2. This is new from 2011. There is chronic osseous fusion at C3-C6, with surrounding disc osteophyte complexes, and chronic disc space  narrowing along the remainder of the cervical and upper thoracic spine. There is grade 2 anterolisthesis of C7 on T1, reflecting underlying facet disease. Prevertebral soft tissues are within normal limits.  The left mandibular condyle is only minimally visualized on this study, possibly reflecting positioning.  A small calcification is noted at the left thyroid lobe. The visualized lung apices are clear. Scattered calcification is noted at the carotid bifurcations bilaterally.  IMPRESSION: 1. No evidence of traumatic intracranial injury or fracture. 2. No evidence of acute fracture or subluxation along the cervical spine. 3. Apparent chronic displaced and angulated fracture of the base of the dens, with associated osseous erosions involving both the dens and the remainder of C2, new from 2011. 4. Mild soft tissue swelling overlying the right temporal parietal calvarium. 5. Moderate cortical volume loss and diffuse small vessel ischemic microangiopathy. 6. Chronic encephalomalacia at the high frontal lobes bilaterally may reflect remote infarct or traumatic injury. 7. Chronic osseous fusion at C3-C6, with diffuse associated degenerative change along the cervical and upper thoracic spine. Chronic grade 2  anterolisthesis of C7 on T1 reflects underlying facet disease. 8. Scattered calcification at the carotid bifurcations bilaterally. Carotid ultrasound could be considered for further evaluation, when and as deemed clinically appropriate.   Electronically Signed   By: Roanna Raider M.D.   On: 08/14/2014 21:24   Dg Hip Unilat With Pelvis 2-3 Views Right  08/14/2014   CLINICAL DATA:  Fall at nursing facility 10 night. Right hip pain and visible deformity. Initial encounter.  EXAM: DG HIP (WITH OR WITHOUT PELVIS) 2-3V RIGHT  COMPARISON:  None.  FINDINGS: A comminuted intertrochanteric right hip fracture is seen, with medial angulation of the distal fracture fragment. No evidence of hip dislocation. No acetabular or  pelvic fracture identified. Generalized osteopenia noted.  IMPRESSION: Comminuted intertrochanteric right hip fracture.   Electronically Signed   By: Myles Rosenthal M.D.   On: 08/14/2014 20:46    EKG: Independently reviewed. Sinus rhythm   Assessment/Plan Active Problems:   GERD (gastroesophageal reflux disease)   Hip fracture   C2 cervical fracture  Right hip fracture X-ray of the hip shows comminuted intertrochanteric right hip fracture, orthopedic surgery was consulted by ED physician. Plan is for surgery in a.m. We'll keep the patient nothing by mouth. Patient has been on Plavix, which will be held in anticipation for possible surgery in a.m.  Chronic C2 cervical spine fracture CT C-spine showed chronic C2 cervical spine fracture with angulation, neurosurgery was consulted by the ED physician. Dr Franky Macho recommends getting an MRI C-spine in a.m. but no surgical option. Continue C-spine collar.  Hypertension Patient has been on chlorthalidone, which will be held in the hospital as patient is nothing by mouth Will start hydralazine 25 mg by mouth every 6 hours when necessary for BP greater than 160/100.  Risk stratification Considering patient's age, she is moderate risk for surgery.  Code status: DO NOT RESUSCITATE  Family discussion: Admission, patients condition and plan of care including tests being ordered have been discussed with the patient's son Chrissie Noa Petzold on phone who indicate understanding and agree with the plan and Code Status.   Time Spent on Admission: 60 min  Dalana Pfahler S [facility] Pager: [phone] 08/14/2014, 10:47 PM  If 7PM-7AM, please contact night-coverage  [email or web address]  Password [email or web address]

## 2014-08-15 ENCOUNTER — Encounter (HOSPITAL_COMMUNITY): Payer: Self-pay | Admitting: *Deleted

## 2014-08-15 ENCOUNTER — Inpatient Hospital Stay (HOSPITAL_COMMUNITY): Payer: Medicare Other

## 2014-08-15 DIAGNOSIS — K219 Gastro-esophageal reflux disease without esophagitis: Secondary | ICD-10-CM

## 2014-08-15 DIAGNOSIS — S72001A Fracture of unspecified part of neck of right femur, initial encounter for closed fracture: Secondary | ICD-10-CM

## 2014-08-15 LAB — BASIC METABOLIC PANEL
ANION GAP: 7 (ref 5–15)
BUN: 24 mg/dL — ABNORMAL HIGH (ref 6–20)
CO2: 28 mmol/L (ref 22–32)
CREATININE: 0.84 mg/dL (ref 0.44–1.00)
Calcium: 8.2 mg/dL — ABNORMAL LOW (ref 8.9–10.3)
Chloride: 98 mmol/L — ABNORMAL LOW (ref 101–111)
GFR calc Af Amer: >60 mL/min (ref >60)
GFR calc non Af Amer: 58 mL/min — ABNORMAL LOW (ref >60)
Glucose, Bld: 101 mg/dL — ABNORMAL HIGH (ref 65–99)
Potassium: 4.1 mmol/L (ref 3.5–5.1)
SODIUM: 133 mmol/L — AB (ref 135–145)

## 2014-08-15 LAB — CBC WITH DIFFERENTIAL/PLATELET
BASOS PCT: 0 % (ref 0–1)
Basophils Absolute: 0 10*3/uL (ref 0.0–0.1)
EOS ABS: 0.1 10*3/uL (ref 0.0–0.7)
Eosinophils Relative: 0 % (ref 0–5)
HCT: 33.4 % — ABNORMAL LOW (ref 36.0–46.0)
Hemoglobin: 11.7 g/dL — ABNORMAL LOW (ref 12.0–15.0)
LYMPHS ABS: 1 10*3/uL (ref 0.7–4.0)
Lymphocytes Relative: 8 % — ABNORMAL LOW (ref 12–46)
MCH: 29.9 pg (ref 26.0–34.0)
MCHC: 35 g/dL (ref 30.0–36.0)
MCV: 85.4 fL (ref 78.0–100.0)
Monocytes Absolute: 1.2 10*3/uL — ABNORMAL HIGH (ref 0.1–1.0)
Monocytes Relative: 10 % (ref 3–12)
Neutro Abs: 9.4 10*3/uL — ABNORMAL HIGH (ref 1.7–7.7)
Neutrophils Relative %: 81 % — ABNORMAL HIGH (ref 43–77)
PLATELETS: 281 10*3/uL (ref 150–400)
RBC: 3.91 MIL/uL (ref 3.87–5.11)
RDW: 13.8 % (ref 11.5–15.5)
WBC: 11.6 10*3/uL — AB (ref 4.0–10.5)

## 2014-08-15 LAB — CBC
HCT: 27.1 % — ABNORMAL LOW (ref 36.0–46.0)
Hemoglobin: 8.8 g/dL — ABNORMAL LOW (ref 12.0–15.0)
MCH: 28.1 pg (ref 26.0–34.0)
MCHC: 32.5 g/dL (ref 30.0–36.0)
MCV: 86.6 fL (ref 78.0–100.0)
Platelets: 365 10*3/uL (ref 150–400)
RBC: 3.13 MIL/uL — ABNORMAL LOW (ref 3.87–5.11)
RDW: 13.7 % (ref 11.5–15.5)
WBC: 10.4 10*3/uL (ref 4.0–10.5)

## 2014-08-15 LAB — PREPARE RBC (CROSSMATCH)

## 2014-08-15 LAB — MRSA PCR SCREENING: MRSA BY PCR: NEGATIVE

## 2014-08-15 MED ORDER — PANTOPRAZOLE SODIUM 40 MG PO TBEC
40.0000 mg | DELAYED_RELEASE_TABLET | Freq: Every day | ORAL | Status: DC
  Administered 2014-08-15 – 2014-08-21 (×7): 40 mg via ORAL
  Filled 2014-08-15 (×7): qty 1

## 2014-08-15 MED ORDER — CARBAMAZEPINE ER 100 MG PO TB12
100.0000 mg | ORAL_TABLET | Freq: Two times a day (BID) | ORAL | Status: DC
  Administered 2014-08-15 – 2014-08-21 (×13): 100 mg via ORAL
  Filled 2014-08-15 (×19): qty 1

## 2014-08-15 MED ORDER — SODIUM CHLORIDE 0.9 % IV SOLN: Freq: Once | INTRAVENOUS | Status: AC

## 2014-08-15 MED ORDER — HYDRALAZINE HCL 25 MG PO TABS: 25.0000 mg | ORAL_TABLET | Freq: Four times a day (QID) | ORAL | Status: DC | PRN

## 2014-08-15 MED ORDER — ALBUTEROL SULFATE (2.5 MG/3ML) 0.083% IN NEBU: 3.0000 mL | INHALATION_SOLUTION | Freq: Four times a day (QID) | RESPIRATORY_TRACT | Status: DC | PRN

## 2014-08-15 MED ORDER — SODIUM CHLORIDE 0.9 % IV SOLN
Freq: Once | INTRAVENOUS | Status: AC
  Administered 2014-08-15: 12:00:00 via INTRAVENOUS

## 2014-08-15 MED ORDER — HYDROCODONE-ACETAMINOPHEN 5-325 MG PO TABS
1.0000 | ORAL_TABLET | Freq: Four times a day (QID) | ORAL | Status: DC | PRN
  Administered 2014-08-15: 1 via ORAL
  Filled 2014-08-15 (×3): qty 1

## 2014-08-15 MED ORDER — POLYETHYLENE GLYCOL 3350 17 G PO PACK
17.0000 g | PACK | Freq: Two times a day (BID) | ORAL | Status: DC
  Administered 2014-08-15 – 2014-08-21 (×10): 17 g via ORAL
  Filled 2014-08-15 (×12): qty 1

## 2014-08-15 MED ORDER — FESOTERODINE FUMARATE ER 4 MG PO TB24
4.0000 mg | ORAL_TABLET | Freq: Every day | ORAL | Status: DC
  Administered 2014-08-15 – 2014-08-21 (×7): 4 mg via ORAL
  Filled 2014-08-15 (×8): qty 1

## 2014-08-15 MED ORDER — BISACODYL 10 MG RE SUPP: 10.0000 mg | Freq: Every day | RECTAL | Status: DC | PRN

## 2014-08-15 MED ORDER — FENTANYL CITRATE (PF) 100 MCG/2ML IJ SOLN
25.0000 ug | INTRAMUSCULAR | Status: DC | PRN
  Administered 2014-08-15 – 2014-08-19 (×6): 25 ug via INTRAVENOUS
  Filled 2014-08-15 (×7): qty 2

## 2014-08-15 MED ORDER — PROMETHAZINE HCL 25 MG/ML IJ SOLN
12.5000 mg | Freq: Four times a day (QID) | INTRAMUSCULAR | Status: DC | PRN
  Administered 2014-08-15: 12.5 mg via INTRAVENOUS
  Filled 2014-08-15: qty 1

## 2014-08-15 MED ORDER — ONDANSETRON HCL 4 MG/2ML IJ SOLN: 4.0000 mg | Freq: Four times a day (QID) | INTRAMUSCULAR | Status: DC | PRN

## 2014-08-15 MED ORDER — SODIUM CHLORIDE 0.9 % IV SOLN
INTRAVENOUS | Status: DC
  Administered 2014-08-15 (×2): via INTRAVENOUS
  Administered 2014-08-16: 50 mL/h via INTRAVENOUS

## 2014-08-15 MED ORDER — CARBIDOPA-LEVODOPA 25-100 MG PO TABS
1.0000 | ORAL_TABLET | Freq: Four times a day (QID) | ORAL | Status: DC
  Administered 2014-08-15 – 2014-08-21 (×22): 1 via ORAL
  Filled 2014-08-15 (×21): qty 1

## 2014-08-15 MED ORDER — ACETAMINOPHEN 325 MG PO TABS: 650.0000 mg | ORAL_TABLET | Freq: Four times a day (QID) | ORAL | Status: DC | PRN

## 2014-08-15 MED ORDER — CETYLPYRIDINIUM CHLORIDE 0.05 % MT LIQD
7.0000 mL | Freq: Two times a day (BID) | OROMUCOSAL | Status: DC
  Administered 2014-08-15 – 2014-08-21 (×9): 7 mL via OROMUCOSAL

## 2014-08-15 MED ORDER — FENTANYL CITRATE (PF) 100 MCG/2ML IJ SOLN: 25.0000 ug | INTRAMUSCULAR | Status: DC | PRN

## 2014-08-15 MED ORDER — HEPARIN SODIUM (PORCINE) 5000 UNIT/ML IJ SOLN: 5000.0000 [IU] | Freq: Three times a day (TID) | INTRAMUSCULAR | Status: DC

## 2014-08-15 MED ORDER — MECLIZINE HCL 25 MG PO TABS
25.0000 mg | ORAL_TABLET | Freq: Three times a day (TID) | ORAL | Status: DC | PRN
Start: 2014-08-15 — End: 2014-08-21
  Filled 2014-08-15: qty 1

## 2014-08-15 NOTE — Progress Notes (Signed)
Addendum  ER did not relate issue ref C spine   So now neurosurgery needs to be consulted regarding Dens injury Ac vs Chronic

## 2014-08-15 NOTE — Progress Notes (Signed)
Will plan for surgery Friday in the afternoon pending [facility] recs on C2 fracture and medical clearance.  Mayra Reel, MD [facility] [phone] 1:16 PM

## 2014-08-15 NOTE — Progress Notes (Signed)
ORTHOPAEDICS   RIGHT HIP FRACTURE NEEDS REPAIR   HG = 8, NEEDS TRANSFUSION  PLAVIX NEEDS PLTS PRIOR TO SURGERY   HOLD ALL ANTICOAGULANTS

## 2014-08-15 NOTE — Progress Notes (Addendum)
PROGRESS NOTE  TEAH VOTAW ZOX:[id number] DOB: 16-Apr-1917 DOA: 08/14/2014 PCP: Ignatius Specking., MD  Summary: 49 yow history of fall presented with hip injury. Imaging revealed right hip fracture. Furthering imaging revealed subacute fracture of dens, no known previous neck injury with history of previous fall. Scalp laceration repaired and tetanus updated. Dr. Lajean Saver discussed with Neurosurgeon Dr. Franky Macho who recommended MRI.  Assessment/Plan: 1. Right hip fracture s/p fall. No further history given due to dementia. Continue pain control. Management per orthopedics. 2. C2 cervical spine fracture suspected chronic. MRI recommended. Currently in c-collar. 3. Scalp laceration s/p fall. Repaired in ED. Will removed stiches 08/20/14. 4. Parkinson's disease. Continue Sinemet. 5. History of TIA. Continue Plavix. 6. Burning mouth syndrome. Continue Tegretol.   Overall stable. Continue pain management.   Coordination of care will include expediting MRI, discussion with neurosurgery in f/u, orthopedics and anesthesia.  If transfer required, will transfer to [place]--discussed in detail with son at bedside who agrees and understands delay in surgery.  ADDENDUM Patient was not able to complete MRI secondary to neck pain. Case was discussed with both orthopedics as well as Dr. Jayme Cloud with anesthesia. It was felt the patient would need intubation and general anesthesia because of Plavix precluding spinal here. Dr. Jayme Cloud recommended transfer to higher level of care after we discussed cervical fracture. This was discussed with Dr. Romeo Apple who agreed.  Orthopedics was contacted. I will discuss with neurosurgery.  Discussed in detail with son at bedside.  ADDENDUM 2 Patient could not tolerate MRI Discussed with Dr. Venetia Maxon neurosurgery.   Recommends: Continue hard collar. No neck surgery at this point. He will speak with Dr. Franky Macho who will f/u once [facility] at [facility] and make further  recommendations re: spine peri-operatively in regard to hip surgery.  Code Status: DNR DVT prophylaxis: Heparin Family Communication: Son and daughter-in-law bedside. Discussed care plan with family and they have no concerns at this time. Disposition Plan: likely SNF  Brendia Sacks, MD  [facility]  Pager [phone] If 7PM-7AM, please contact night-coverage at [email or web address], password [facility] 08/15/2014, 7:31 AM  LOS: 1 day   Consultants:  Orthopedics   Procedures:    Antibiotics:    HPI/Subjective: Feeling fine except pain and weakness in right hip. Denies pain or discomfort in the neck. Still unclear about mechanism of fall, although she does remember getting ready for a bath. Denies LOC and syncopical episode. No reports of chest pain, shortness of breath, n/v/d, or abd pain.  Objective: Filed Vitals:   08/14/14 2227 08/14/14 2230 08/15/14 0014 08/15/14 0635  BP: 194/100 164/87 139/83 123/43  Pulse: 85 83 91 82  Temp:   97.8 F (36.6 C) 98 F (36.7 C)  TempSrc:   Oral Oral  Resp: 19 16 20 20   Height:      Weight:      SpO2: 96% 93% 97% 96%    Intake/Output Summary (Last 24 hours) at 08/15/14 0731 Last data filed at 08/15/14 0427  Gross per 24 hour  Intake  192.5 ml  Output      0 ml  Net  192.5 ml     Filed Weights   08/14/14 1953 08/14/14 1958  Weight: 54.432 kg (120 lb) 54.432 kg (120 lb)    Exam:     Afebrile VSS, not hypoxic  General:  Appears comfortable, calm. Laying in bed wearing neck collar Eyes: PERRL, normal lids, irises ENT: grossly normal hearing, lips, tongue Neck: In neck collar. Cardiovascular: Regular rate and rhythm, no  murmur, rub or gallop. No lower extremity edema.  Respiratory: Clear to auscultation bilaterally, no wheezes, rales or rhonchi. Normal respiratory effort Abdomen: soft, ntnd Skin: no rash or induration noted Musculoskeletal: Right leg- knee and hip flexed at 90 degrees. Moves arms and legs  appropriately Psychiatric: grossly normal mood and affect, speech fluent and appropriate Neurologic: grossly non-focal. Foley catheter present.  New data reviewed:  Sodium 133  Glucose 101   BMP unremarkable  HGB 8.8 down from 10.8  Leukocytosis resolved, WBC normal  Pertinent data since admission: CXR independently reviewed- No acute disease CT Head- no acute injury noted CT Cervical spine-Appearent chronic displaced/angulated fx base of dens new since 2011. Chronic osseous fusion C3--C6 EKG-SR no acute changes.   Pending data:    Scheduled Meds: . sodium chloride   Intravenous Once  . sodium chloride   Intravenous Once  . carbamazepine  100 mg Oral BID  . carbidopa-levodopa  1 tablet Oral QID  . fesoterodine  4 mg Oral Daily  . heparin  5,000 Units Subcutaneous 3 times per day  . pantoprazole  40 mg Oral Daily   Continuous Infusions: . sodium chloride 50 mL/hr at 08/15/14 0036    Principal Problem:   Intertrochanteric fracture of right hip Active Problems:   GERD (gastroesophageal reflux disease)   C2 cervical fracture   Time spent 40 minutes, greater than 50% coordination of care.  Greggory Keen Jari Pigg, acting as scribe, recorded this note contemporaneously in the presence of Dr. Melton Alar. Irene Limbo, M.D. on 08/15/2014 .  I have reviewed the above documentation for accuracy and completeness, and I agree with the above. Brendia Sacks, MD

## 2014-08-15 NOTE — Plan of Care (Signed)
[facility] PROGRESS NOTE  Michele Goodwin  ZOX:[id number]  DOB: 24-Nov-1917  DOS: 08/15/2014  PCP: Ignatius Specking., MD  81 year old female presents with a mechanical fall. Patient was transferred from [facility].  Patient denies any active complaints.C-collar in place. Hemodynamically stable.  Initially seen there by my colleague Dr. Irene Limbo. Patient was transferred to higher level of care secondary to presence of C2 fracture as well as requirement for the repair of the right hip fracture. H&H transfusion 2 units has been completed. we'll check CBC. Plavix is on hold. Platelet transfusion can be ordered in the morning by orthopedics to be given before surgery, if required. Neurosurgery will see the patient overnight. Nothing by mouth after midnight order placed.  Rest of the orders were carried forward appropriately.  Author: Lynden Oxford, MD [facility] Pager: [phone] 08/15/2014 9:04 PM   If 7PM-7AM, please contact night-coverage at [email or web address], password [facility]

## 2014-08-15 NOTE — Plan of Care (Signed)
Problem: Phase I Progression Outcomes Goal: Incision/dressings dry and intact Outcome: Not Progressing Pt has been having some drainage Goal: Voiding-avoid urinary catheter unless indicated Outcome: Progressing Catheter in place

## 2014-08-15 NOTE — Consult Note (Addendum)
Consult note orthopedics  Consult by Dr. Manus Gunning and then Dr. Sharl Ma for right hip fracture  81 year old female fell at a nursing home. Ambulates with a walker. I was told in the ER that her CAT scan was normal, however on rounds this morning she had a c-collar on. I looked at her CT scan and she has a questionable acute versus chronic fracture at C2. She complains of nonradiating pain over the right hip and inability to walk and she also complains of pain in the left knee although clinical exam is normal. She is on Plavix questionable reason.  Review of systems could not be obtained due to acuteness and overall mental status and medication  Past Medical History  Diagnosis Date  . GERD (gastroesophageal reflux disease)   . Nausea & vomiting   . Gastroparesis    Past Surgical History  Procedure Laterality Date  . Appendectomy    . Tonsillectomy    . Colonoscopy    . Upper gastrointestinal endoscopy      Family history noncontributory social history lives in a nursing home History  Substance Use Topics  . Smoking status: Never Smoker   . Smokeless tobacco: Never Used  . Alcohol Use: No    BP 123/43 mmHg  Pulse 82  Temp(Src) 98 F (36.7 C) (Oral)  Resp 20  Ht  (1.575 m)  Wt 120 lb (54.432 kg)  BMI 21.94 kg/m2  SpO2 96% The patient was awake and was able to question her she seemed to answer appropriately she has a small frame she has deformity of the right lower extremity General appearance was normal. Mood was somnolent. Gait not obtainable patient cannot walk secondary to fracture We do note normal grip strength normal upper extremity reflexes normal muscle tone no posturing, no deformities in her upper extremities range of motion within normal limits all joints reduced without subluxation mild arthritic changes in the hand  Right leg is tucked under her we did not move it and I reexamined her left knee it was clinically normal. Distally she had no neurovascular  compromise. Left hip is normal in terms of range of motion stability strength and alignment  Coordination tests were deferred secondary to patient's overall mental condition.  Cervical and groin lymph nodes normal right groin lymph nodes not assessed because of fracture  C-collar hard collar in place.   Assessment Right hip fracture 3 part intertrochanteric fracture will require gamma nail fixation. Risks versus benefits of surgery versus no surgery definitely point toward surgical stabilization.  Our concerns at this point are #1 she is on Plavix. I will order platelets to take care of that. She is also anemic I will order blood to take care of that.  The other concern is the C2 acute versus chronic fracture will require Neurosurgical input and then anesthesia input as to whether they feel comfortable intubating her versus spinal in the setting of Plavix even with the platelets.  After I get the neurosurgical input I will converse with anesthesia and then we will make a plan as to whether the patient can have surgery here.

## 2014-08-15 NOTE — Consult Note (Signed)
Reason for Consult:C2 fracture Referring Physician: [name] is an 81 y.o. female.  HPI: whom fell yesterday. Head CT revealed a C2 type ll fracture, acuity unknown. She also has a right hip fracture which needs to be repaired. [facility]. Currently in cervical collar.  Past Medical History  Diagnosis Date  . GERD (gastroesophageal reflux disease)   . Gastroparesis   . Parkinson's disease   . Burning mouth syndrome   . PUD (peptic ulcer disease)   . CVA (cerebral infarction)     Past Surgical History  Procedure Laterality Date  . Appendectomy    . Tonsillectomy    . Colonoscopy    . Upper gastrointestinal endoscopy      Family History  Problem Relation Age of Onset  . Colon cancer Mother   . Heart disease Father   . Healthy Son     Social History:  reports that she has never smoked. She has never used smokeless tobacco. She reports that she does not drink alcohol or use illicit drugs.  Allergies:  Allergies  Allergen Reactions  . Codeine   . Demerol   . Morphine And Related     Medications: I have reviewed the patient's current medications.  Results for orders placed or performed during the hospital encounter of 08/14/14 (from the past 48 hour(s))  CBC with Differential/Platelet     Status: Abnormal   Collection Time: 08/14/14  8:42 PM  Result Value Ref Range   WBC 13.8 (H) 4.0 - 10.5 K/uL   RBC 3.84 (L) 3.87 - 5.11 MIL/uL   Hemoglobin 10.8 (L) 12.0 - 15.0 g/dL   HCT 33.2 (L) 36.0 - 46.0 %   MCV 86.5 78.0 - 100.0 fL   MCH 28.1 26.0 - 34.0 pg   MCHC 32.5 30.0 - 36.0 g/dL   RDW 13.7 11.5 - 15.5 %   Platelets 378 150 - 400 K/uL   Neutrophils Relative % 81 (H) 43 - 77 %   Neutro Abs 11.0 (H) 1.7 - 7.7 K/uL   Lymphocytes Relative 11 (L) 12 - 46 %   Lymphs Abs 1.5 0.7 - 4.0 K/uL   Monocytes Relative 6 3 - 12 %   Monocytes Absolute 0.9 0.1 - 1.0 K/uL   Eosinophils Relative 2 0 - 5 %   Eosinophils Absolute 0.3 0.0 -  0.7 K/uL   Basophils Relative 0 0 - 1 %   Basophils Absolute 0.1 0.0 - 0.1 K/uL  Basic metabolic panel     Status: Abnormal   Collection Time: 08/14/14  8:42 PM  Result Value Ref Range   Sodium 134 (L) 135 - 145 mmol/L   Potassium 4.4 3.5 - 5.1 mmol/L   Chloride 98 (L) 101 - 111 mmol/L   CO2 26 22 - 32 mmol/L   Glucose, Bld 126 (H) 65 - 99 mg/dL   BUN 27 (H) 6 - 20 mg/dL   Creatinine, Ser 1.01 (H) 0.44 - 1.00 mg/dL   Calcium 8.5 (L) 8.9 - 10.3 mg/dL   GFR calc non Af Amer 46 (L) >60 mL/min   GFR calc Af Amer 53 (L) >60 mL/min    Comment: (NOTE) The eGFR has been calculated using the CKD EPI equation. This calculation has not been validated in all clinical situations. eGFR's persistently <60 mL/min signify possible Chronic Kidney Disease.    Anion gap 10 5 - 15  Protime-INR     Status: None   Collection  Time: 08/14/14  8:42 PM  Result Value Ref Range   Prothrombin Time 13.8 11.6 - 15.2 seconds   INR 1.04 0.00 - 1.49  MRSA PCR Screening     Status: None   Collection Time: 08/15/14  2:20 AM  Result Value Ref Range   MRSA by PCR NEGATIVE NEGATIVE    Comment:        The GeneXpert MRSA Assay (FDA approved for NASAL specimens only), is one component of a comprehensive MRSA colonization surveillance program. It is not intended to diagnose MRSA infection nor to guide or monitor treatment for MRSA infections.   CBC     Status: Abnormal   Collection Time: 08/15/14  5:39 AM  Result Value Ref Range   WBC 10.4 4.0 - 10.5 K/uL   RBC 3.13 (L) 3.87 - 5.11 MIL/uL   Hemoglobin 8.8 (L) 12.0 - 15.0 g/dL   HCT 27.1 (L) 36.0 - 46.0 %   MCV 86.6 78.0 - 100.0 fL   MCH 28.1 26.0 - 34.0 pg   MCHC 32.5 30.0 - 36.0 g/dL   RDW 13.7 11.5 - 15.5 %   Platelets 365 150 - 400 K/uL  Basic metabolic panel     Status: Abnormal   Collection Time: 08/15/14  5:39 AM  Result Value Ref Range   Sodium 133 (L) 135 - 145 mmol/L   Potassium 4.1 3.5 - 5.1 mmol/L   Chloride 98 (L) 101 - 111 mmol/L   CO2  28 22 - 32 mmol/L   Glucose, Bld 101 (H) 65 - 99 mg/dL   BUN 24 (H) 6 - 20 mg/dL   Creatinine, Ser 0.84 0.44 - 1.00 mg/dL   Calcium 8.2 (L) 8.9 - 10.3 mg/dL   GFR calc non Af Amer 58 (L) >60 mL/min   GFR calc Af Amer >60 >60 mL/min    Comment: (NOTE) The eGFR has been calculated using the CKD EPI equation. This calculation has not been validated in all clinical situations. eGFR's persistently <60 mL/min signify possible Chronic Kidney Disease.    Anion gap 7 5 - 15  Prepare RBC     Status: None   Collection Time: 08/15/14  7:40 AM  Result Value Ref Range   Order Confirmation ORDER PROCESSED BY BLOOD BANK   Type and screen     Status: None (Preliminary result)   Collection Time: 08/15/14  7:40 AM  Result Value Ref Range   ABO/RH(D) A POS    Antibody Screen NEG    Sample Expiration 08/18/2014    Unit Number [id number]    Blood Component Type RED CELLS,LR    Unit division 00    Status of Unit ALLOCATED    Transfusion Status OK TO TRANSFUSE    Crossmatch Result Compatible    Unit Number [id number]    Blood Component Type RED CELLS,LR    Unit division 00    Status of Unit ALLOCATED    Transfusion Status OK TO TRANSFUSE    Crossmatch Result Compatible    Unit Number [id number]    Blood Component Type RED CELLS,LR    Unit division 00    Status of Unit ISSUED    Transfusion Status OK TO TRANSFUSE    Crossmatch Result Compatible    Unit Number [id number]    Blood Component Type RED CELLS,LR    Unit division 00    Status of Unit ISSUED    Transfusion Status OK TO TRANSFUSE    Crossmatch Result Compatible  Dg Chest 1 View  08/14/2014   CLINICAL DATA:  Fall tonight with right hip pain.  EXAM: CHEST  1 VIEW  COMPARISON:  08/04/2014  FINDINGS: Lungs are adequately inflated without focal consolidation or effusion. No evidence pneumothorax. Mild stable cardiomegaly. Calcified plaque over the aortic arch. Biphasic curvature of the thoracolumbar spine with degenerative  changes throughout the spine. Moderate degenerative change of the shoulders.  IMPRESSION: No active disease.   Electronically Signed   By: Marin Olp M.D.   On: 08/14/2014 20:46   Ct Head Wo Contrast  08/14/2014   CLINICAL DATA:  Status post fall, with concern for head or cervical spine injury. Initial encounter.  EXAM: CT HEAD WITHOUT CONTRAST  CT CERVICAL SPINE WITHOUT CONTRAST  TECHNIQUE: Multidetector CT imaging of the head and cervical spine was performed following the standard protocol without intravenous contrast. Multiplanar CT image reconstructions of the cervical spine were also generated.  COMPARISON:  CT of the head performed 06/27/2014, CT of the cervical spine performed 02/26/2009, and MRI of the brain performed 04/03/2009  FINDINGS: CT HEAD FINDINGS  There is no evidence of acute infarction, mass lesion, or intra- or extra-axial hemorrhage on CT.  Prominence of the ventricles and sulci reflects moderate cortical volume loss. Diffuse periventricular and subcortical white matter change likely reflects small vessel ischemic microangiopathy. Chronic encephalomalacia at the high frontal lobes bilaterally may reflect remote infarct or traumatic injury. Cerebellar atrophy is noted.  The brainstem and fourth ventricle are within normal limits. The basal ganglia are unremarkable in appearance. No mass effect or midline shift is seen.  There is no evidence of fracture; visualized osseous structures are unremarkable in appearance. The orbits are within normal limits. The paranasal sinuses and mastoid air cells are well-aerated. Mild soft tissue swelling is noted overlying the right temporoparietal calvarium.  CT CERVICAL SPINE FINDINGS  There is no evidence of acute fracture or subluxation. There appears to be a chronic displaced and angulated fracture of the base of the dens, with associated osseous erosions involving both the dens and the remainder of C2. This is new from 2011. There is chronic osseous  fusion at C3-C6, with surrounding disc osteophyte complexes, and chronic disc space narrowing along the remainder of the cervical and upper thoracic spine. There is grade 2 anterolisthesis of C7 on T1, reflecting underlying facet disease. Prevertebral soft tissues are within normal limits.  The left mandibular condyle is only minimally visualized on this study, possibly reflecting positioning.  A small calcification is noted at the left thyroid lobe. The visualized lung apices are clear. Scattered calcification is noted at the carotid bifurcations bilaterally.  IMPRESSION: 1. No evidence of traumatic intracranial injury or fracture. 2. No evidence of acute fracture or subluxation along the cervical spine. 3. Apparent chronic displaced and angulated fracture of the base of the dens, with associated osseous erosions involving both the dens and the remainder of C2, new from 2011. 4. Mild soft tissue swelling overlying the right temporal parietal calvarium. 5. Moderate cortical volume loss and diffuse small vessel ischemic microangiopathy. 6. Chronic encephalomalacia at the high frontal lobes bilaterally may reflect remote infarct or traumatic injury. 7. Chronic osseous fusion at C3-C6, with diffuse associated degenerative change along the cervical and upper thoracic spine. Chronic grade 2 anterolisthesis of C7 on T1 reflects underlying facet disease. 8. Scattered calcification at the carotid bifurcations bilaterally. Carotid ultrasound could be considered for further evaluation, when and as deemed clinically appropriate.   Electronically Signed   By:  Garald Balding M.D.   On: 08/14/2014 21:24   Ct Cervical Spine Wo Contrast  08/14/2014   CLINICAL DATA:  Status post fall, with concern for head or cervical spine injury. Initial encounter.  EXAM: CT HEAD WITHOUT CONTRAST  CT CERVICAL SPINE WITHOUT CONTRAST  TECHNIQUE: Multidetector CT imaging of the head and cervical spine was performed following the standard protocol  without intravenous contrast. Multiplanar CT image reconstructions of the cervical spine were also generated.  COMPARISON:  CT of the head performed 06/27/2014, CT of the cervical spine performed 02/26/2009, and MRI of the brain performed 04/03/2009  FINDINGS: CT HEAD FINDINGS  There is no evidence of acute infarction, mass lesion, or intra- or extra-axial hemorrhage on CT.  Prominence of the ventricles and sulci reflects moderate cortical volume loss. Diffuse periventricular and subcortical white matter change likely reflects small vessel ischemic microangiopathy. Chronic encephalomalacia at the high frontal lobes bilaterally may reflect remote infarct or traumatic injury. Cerebellar atrophy is noted.  The brainstem and fourth ventricle are within normal limits. The basal ganglia are unremarkable in appearance. No mass effect or midline shift is seen.  There is no evidence of fracture; visualized osseous structures are unremarkable in appearance. The orbits are within normal limits. The paranasal sinuses and mastoid air cells are well-aerated. Mild soft tissue swelling is noted overlying the right temporoparietal calvarium.  CT CERVICAL SPINE FINDINGS  There is no evidence of acute fracture or subluxation. There appears to be a chronic displaced and angulated fracture of the base of the dens, with associated osseous erosions involving both the dens and the remainder of C2. This is new from 2011. There is chronic osseous fusion at C3-C6, with surrounding disc osteophyte complexes, and chronic disc space narrowing along the remainder of the cervical and upper thoracic spine. There is grade 2 anterolisthesis of C7 on T1, reflecting underlying facet disease. Prevertebral soft tissues are within normal limits.  The left mandibular condyle is only minimally visualized on this study, possibly reflecting positioning.  A small calcification is noted at the left thyroid lobe. The visualized lung apices are clear. Scattered  calcification is noted at the carotid bifurcations bilaterally.  IMPRESSION: 1. No evidence of traumatic intracranial injury or fracture. 2. No evidence of acute fracture or subluxation along the cervical spine. 3. Apparent chronic displaced and angulated fracture of the base of the dens, with associated osseous erosions involving both the dens and the remainder of C2, new from 2011. 4. Mild soft tissue swelling overlying the right temporal parietal calvarium. 5. Moderate cortical volume loss and diffuse small vessel ischemic microangiopathy. 6. Chronic encephalomalacia at the high frontal lobes bilaterally may reflect remote infarct or traumatic injury. 7. Chronic osseous fusion at C3-C6, with diffuse associated degenerative change along the cervical and upper thoracic spine. Chronic grade 2 anterolisthesis of C7 on T1 reflects underlying facet disease. 8. Scattered calcification at the carotid bifurcations bilaterally. Carotid ultrasound could be considered for further evaluation, when and as deemed clinically appropriate.   Electronically Signed   By: Garald Balding M.D.   On: 08/14/2014 21:24   Dg Hip Unilat With Pelvis 2-3 Views Right  08/14/2014   CLINICAL DATA:  Fall at nursing facility 10 night. Right hip pain and visible deformity. Initial encounter.  EXAM: DG HIP (WITH OR WITHOUT PELVIS) 2-3V RIGHT  COMPARISON:  None.  FINDINGS: A comminuted intertrochanteric right hip fracture is seen, with medial angulation of the distal fracture fragment. No evidence of hip dislocation. No acetabular  or pelvic fracture identified. Generalized osteopenia noted.  IMPRESSION: Comminuted intertrochanteric right hip fracture.   Electronically Signed   By: Earle Gell M.D.   On: 08/14/2014 20:46    Review of Systems  Constitutional: Negative.   HENT:       Scalp laceration Painful mouth syndrome  Eyes: Negative.   Respiratory: Negative.   Cardiovascular: Negative.   Gastrointestinal: Negative.   Musculoskeletal:  Positive for joint pain, falls and neck pain.  Skin: Negative.   Neurological: Positive for focal weakness.       Dementia, parkinsons  Endo/Heme/Allergies: Negative.   Psychiatric/Behavioral: Positive for memory loss.   Blood pressure 161/69, pulse 97, temperature 98.9 F (37.2 C), temperature source Oral, resp. rate 18, height $RemoveBe'5\' 2"'akdjpmbio$  (1.575 m), weight 54.432 kg (120 lb), SpO2 96 %. Physical Exam  Constitutional: She appears well-developed and well-nourished. No distress.  HENT:  Right scalp laceration, closed with staples  Eyes: Conjunctivae and EOM are normal. Pupils are equal, round, and reactive to light.  Neck:  In cervical collar  Respiratory: Effort normal and breath sounds normal.  GI: Soft. Bowel sounds are normal.  Musculoskeletal: She exhibits tenderness.  Painful right hip  Neurological: She is alert. A cranial nerve deficit and sensory deficit is present. GCS eye subscore is 4. GCS verbal subscore is 5. GCS motor subscore is 6.  Follows commands Gait not assessed Can move all extremities, weak dorsiflexion on right Decreased hearing, intact to voice Mild weakness overall, but that is to be expected on manual examination    Assessment/Plan: Michele Goodwin is not a surgical candidate for fixation of the cervical spine. Nor will she be placed in rigid fixation of the cervical spine given her age. Halo's nor surgery work well in this age group. She is protected  In the cervical collar. MRI only necessary to date the C2 abnormality. She is neurologically intact, and moves all extremities. She can be taken to the operating room, the collar at this time should not be removed. Call if there are questions.   Michele Goodwin L 08/15/2014, 8:44 PM

## 2014-08-15 NOTE — Progress Notes (Signed)
Pt unable to tolerate MRI per MRI tech d/t pain and not being able to lie flat.  Dr. Irene Limbo made aware.

## 2014-08-15 NOTE — Progress Notes (Signed)
Follow-up regarding this patient's hip fracture and neck problems questionable C-spine acute versus chronic injury.  As my note previously indicates there was concern about the issue with Plavix and doing a spinal which meant we would have to go to a general anesthesia and with a neck issue there was concern about intubation. After my conversation with Dr. Irene Limbo was spoken to our anesthesiologist the patient will be transferred to [facility] for further management.

## 2014-08-16 ENCOUNTER — Inpatient Hospital Stay (HOSPITAL_COMMUNITY): Payer: Medicare Other | Admitting: Certified Registered Nurse Anesthetist

## 2014-08-16 ENCOUNTER — Encounter (HOSPITAL_COMMUNITY)
Admission: EM | Disposition: A | Discharge status: Skilled Nursing Facility | Payer: Self-pay | Source: Home / Self Care | Attending: Internal Medicine

## 2014-08-16 ENCOUNTER — Inpatient Hospital Stay (HOSPITAL_COMMUNITY): Payer: Medicare Other

## 2014-08-16 ENCOUNTER — Encounter (HOSPITAL_COMMUNITY): Payer: Self-pay | Admitting: Certified Registered Nurse Anesthetist

## 2014-08-16 HISTORY — PX: INTRAMEDULLARY (IM) NAIL INTERTROCHANTERIC: SHX5875

## 2014-08-16 LAB — CBC
HEMATOCRIT: 34.8 % — AB (ref 36.0–46.0)
Hemoglobin: 11.8 g/dL — ABNORMAL LOW (ref 12.0–15.0)
MCH: 29.8 pg (ref 26.0–34.0)
MCHC: 33.9 g/dL (ref 30.0–36.0)
MCV: 87.9 fL (ref 78.0–100.0)
PLATELETS: 249 10*3/uL (ref 150–400)
RBC: 3.96 MIL/uL (ref 3.87–5.11)
RDW: 14.3 % (ref 11.5–15.5)
WBC: 14.4 10*3/uL — AB (ref 4.0–10.5)

## 2014-08-16 LAB — CREATININE, SERUM
Creatinine, Ser: 0.93 mg/dL (ref 0.44–1.00)
GFR, EST AFRICAN AMERICAN: 59 mL/min — AB (ref >60)
GFR, EST NON AFRICAN AMERICAN: 51 mL/min — AB (ref >60)

## 2014-08-16 LAB — SURGICAL PCR SCREEN
MRSA, PCR: NEGATIVE
STAPHYLOCOCCUS AUREUS: NEGATIVE

## 2014-08-16 SURGERY — FIXATION, FRACTURE, INTERTROCHANTERIC, WITH INTRAMEDULLARY ROD
Anesthesia: General | Site: Hip | Laterality: Right

## 2014-08-16 MED ORDER — ONDANSETRON HCL 4 MG/2ML IJ SOLN
INTRAMUSCULAR | Status: DC | PRN
  Administered 2014-08-16: 4 mg via INTRAVENOUS

## 2014-08-16 MED ORDER — GLYCOPYRROLATE 0.2 MG/ML IJ SOLN
INTRAMUSCULAR | Status: AC
  Filled 2014-08-16: qty 2

## 2014-08-16 MED ORDER — ACETAMINOPHEN 650 MG RE SUPP: 650.0000 mg | Freq: Four times a day (QID) | RECTAL | Status: DC | PRN

## 2014-08-16 MED ORDER — LACTATED RINGERS IV SOLN: INTRAVENOUS | Status: DC

## 2014-08-16 MED ORDER — GLYCOPYRROLATE 0.2 MG/ML IJ SOLN
INTRAMUSCULAR | Status: DC | PRN
  Administered 2014-08-16: 0.4 mg via INTRAVENOUS

## 2014-08-16 MED ORDER — METHOCARBAMOL 500 MG PO TABS
500.0000 mg | ORAL_TABLET | Freq: Four times a day (QID) | ORAL | Status: DC | PRN
  Administered 2014-08-17 (×2): 500 mg via ORAL
  Filled 2014-08-16 (×2): qty 1

## 2014-08-16 MED ORDER — PROPOFOL 10 MG/ML IV BOLUS
INTRAVENOUS | Status: DC | PRN
  Administered 2014-08-16: 20 mg via INTRAVENOUS
  Administered 2014-08-16: 100 mg via INTRAVENOUS

## 2014-08-16 MED ORDER — 0.9 % SODIUM CHLORIDE (POUR BTL) OPTIME
TOPICAL | Status: DC | PRN
  Administered 2014-08-16: 1000 mL

## 2014-08-16 MED ORDER — LACTATED RINGERS IV SOLN
INTRAVENOUS | Status: DC | PRN
  Administered 2014-08-16: 13:00:00 via INTRAVENOUS

## 2014-08-16 MED ORDER — CEFAZOLIN SODIUM-DEXTROSE 2-3 GM-% IV SOLR
INTRAVENOUS | Status: AC
  Filled 2014-08-16: qty 50

## 2014-08-16 MED ORDER — HYDROCODONE-ACETAMINOPHEN 7.5-325 MG PO TABS: 1.0000 | ORAL_TABLET | Freq: Four times a day (QID) | ORAL | Status: AC | PRN

## 2014-08-16 MED ORDER — LACTATED RINGERS IV SOLN
INTRAVENOUS | Status: DC
  Administered 2014-08-16: 13:00:00 via INTRAVENOUS

## 2014-08-16 MED ORDER — MENTHOL 3 MG MT LOZG: 1.0000 | LOZENGE | OROMUCOSAL | Status: DC | PRN

## 2014-08-16 MED ORDER — PHENYLEPHRINE HCL 10 MG/ML IJ SOLN
10.0000 mg | INTRAVENOUS | Status: DC | PRN
  Administered 2014-08-16: 20 ug/min via INTRAVENOUS

## 2014-08-16 MED ORDER — FENTANYL CITRATE (PF) 100 MCG/2ML IJ SOLN
INTRAMUSCULAR | Status: DC | PRN
  Administered 2014-08-16 (×4): 25 ug via INTRAVENOUS

## 2014-08-16 MED ORDER — HYDROCODONE-ACETAMINOPHEN 5-325 MG PO TABS
1.0000 | ORAL_TABLET | Freq: Four times a day (QID) | ORAL | Status: DC | PRN
  Administered 2014-08-17 – 2014-08-19 (×2): 1 via ORAL
  Administered 2014-08-21 (×2): 2 via ORAL
  Filled 2014-08-16: qty 2
  Filled 2014-08-16: qty 1
  Filled 2014-08-16: qty 2
  Filled 2014-08-16: qty 1

## 2014-08-16 MED ORDER — OXYCODONE HCL 5 MG PO TABS
5.0000 mg | ORAL_TABLET | ORAL | Status: DC | PRN
Start: 2014-08-16 — End: 2014-08-21
  Administered 2014-08-17 (×3): 5 mg via ORAL
  Filled 2014-08-16 (×4): qty 1

## 2014-08-16 MED ORDER — PHENYLEPHRINE HCL 10 MG/ML IJ SOLN
INTRAMUSCULAR | Status: DC | PRN
  Administered 2014-08-16: 60 ug via INTRAVENOUS

## 2014-08-16 MED ORDER — STERILE WATER FOR INJECTION IJ SOLN
INTRAMUSCULAR | Status: AC
  Filled 2014-08-16: qty 10

## 2014-08-16 MED ORDER — NEOSTIGMINE METHYLSULFATE 10 MG/10ML IV SOLN
INTRAVENOUS | Status: DC | PRN
  Administered 2014-08-16: 3 mg via INTRAVENOUS

## 2014-08-16 MED ORDER — FENTANYL CITRATE (PF) 250 MCG/5ML IJ SOLN
INTRAMUSCULAR | Status: AC
  Filled 2014-08-16: qty 5

## 2014-08-16 MED ORDER — ONDANSETRON HCL 4 MG PO TABS: 4.0000 mg | ORAL_TABLET | Freq: Four times a day (QID) | ORAL | Status: DC | PRN

## 2014-08-16 MED ORDER — PROPOFOL 10 MG/ML IV BOLUS
INTRAVENOUS | Status: AC
  Filled 2014-08-16: qty 20

## 2014-08-16 MED ORDER — PHENOL 1.4 % MT LIQD: 1.0000 | OROMUCOSAL | Status: DC | PRN

## 2014-08-16 MED ORDER — METHOCARBAMOL 1000 MG/10ML IJ SOLN
500.0000 mg | Freq: Four times a day (QID) | INTRAVENOUS | Status: DC | PRN
  Filled 2014-08-16: qty 5

## 2014-08-16 MED ORDER — EPHEDRINE SULFATE 50 MG/ML IJ SOLN
INTRAMUSCULAR | Status: AC
  Filled 2014-08-16: qty 1

## 2014-08-16 MED ORDER — SODIUM CHLORIDE 0.9 % IV SOLN
INTRAVENOUS | Status: DC
  Administered 2014-08-17 – 2014-08-19 (×4): via INTRAVENOUS
  Administered 2014-08-19: 75 mL/h via INTRAVENOUS

## 2014-08-16 MED ORDER — LIDOCAINE HCL (CARDIAC) 20 MG/ML IV SOLN
INTRAVENOUS | Status: AC
  Filled 2014-08-16: qty 5

## 2014-08-16 MED ORDER — ONDANSETRON HCL 4 MG/2ML IJ SOLN: 4.0000 mg | Freq: Four times a day (QID) | INTRAMUSCULAR | Status: DC | PRN

## 2014-08-16 MED ORDER — FENTANYL CITRATE (PF) 100 MCG/2ML IJ SOLN
25.0000 ug | INTRAMUSCULAR | Status: DC | PRN
  Administered 2014-08-16: 25 ug via INTRAVENOUS

## 2014-08-16 MED ORDER — FENTANYL CITRATE (PF) 100 MCG/2ML IJ SOLN
INTRAMUSCULAR | Status: AC
  Administered 2014-08-16: 25 ug via INTRAVENOUS
  Filled 2014-08-16: qty 2

## 2014-08-16 MED ORDER — ONDANSETRON HCL 4 MG/2ML IJ SOLN
INTRAMUSCULAR | Status: AC
  Filled 2014-08-16: qty 2

## 2014-08-16 MED ORDER — PROMETHAZINE HCL 25 MG/ML IJ SOLN: 6.2500 mg | INTRAMUSCULAR | Status: DC | PRN

## 2014-08-16 MED ORDER — SUCCINYLCHOLINE CHLORIDE 20 MG/ML IJ SOLN
INTRAMUSCULAR | Status: AC
  Filled 2014-08-16: qty 1

## 2014-08-16 MED ORDER — CEFAZOLIN SODIUM-DEXTROSE 2-3 GM-% IV SOLR
INTRAVENOUS | Status: DC | PRN
  Administered 2014-08-16: 2 g via INTRAVENOUS

## 2014-08-16 MED ORDER — METOCLOPRAMIDE HCL 5 MG/ML IJ SOLN: 5.0000 mg | Freq: Three times a day (TID) | INTRAMUSCULAR | Status: DC | PRN

## 2014-08-16 MED ORDER — ALUM & MAG HYDROXIDE-SIMETH 200-200-20 MG/5ML PO SUSP: 30.0000 mL | ORAL | Status: DC | PRN

## 2014-08-16 MED ORDER — LIDOCAINE HCL (CARDIAC) 20 MG/ML IV SOLN
INTRAVENOUS | Status: DC | PRN
  Administered 2014-08-16: 60 mg via INTRAVENOUS

## 2014-08-16 MED ORDER — CEFAZOLIN SODIUM-DEXTROSE 2-3 GM-% IV SOLR
2.0000 g | Freq: Four times a day (QID) | INTRAVENOUS | Status: AC
  Administered 2014-08-16 – 2014-08-17 (×3): 2 g via INTRAVENOUS
  Filled 2014-08-16 (×4): qty 50

## 2014-08-16 MED ORDER — ASPIRIN EC 325 MG PO TBEC: 325.0000 mg | DELAYED_RELEASE_TABLET | Freq: Two times a day (BID) | ORAL | Status: AC

## 2014-08-16 MED ORDER — ROCURONIUM BROMIDE 100 MG/10ML IV SOLN
INTRAVENOUS | Status: DC | PRN
  Administered 2014-08-16: 30 mg via INTRAVENOUS

## 2014-08-16 MED ORDER — ACETAMINOPHEN 325 MG PO TABS
650.0000 mg | ORAL_TABLET | Freq: Four times a day (QID) | ORAL | Status: DC | PRN
  Administered 2014-08-19: 650 mg via ORAL
  Filled 2014-08-16: qty 2

## 2014-08-16 MED ORDER — CLOPIDOGREL BISULFATE 75 MG PO TABS: 75.0000 mg | ORAL_TABLET | Freq: Every day | ORAL | Status: AC

## 2014-08-16 MED ORDER — METOCLOPRAMIDE HCL 5 MG PO TABS: 5.0000 mg | ORAL_TABLET | Freq: Three times a day (TID) | ORAL | Status: DC | PRN

## 2014-08-16 MED ORDER — ROCURONIUM BROMIDE 50 MG/5ML IV SOLN
INTRAVENOUS | Status: AC
  Filled 2014-08-16: qty 1

## 2014-08-16 MED ORDER — ENOXAPARIN SODIUM 40 MG/0.4ML ~~LOC~~ SOLN
40.0000 mg | SUBCUTANEOUS | Status: DC
  Administered 2014-08-17: 40 mg via SUBCUTANEOUS
  Filled 2014-08-16: qty 0.4

## 2014-08-16 MED FILL — Fentanyl Citrate Preservative Free (PF) Inj 100 MCG/2ML: INTRAMUSCULAR | Qty: 2 | Status: AC

## 2014-08-16 SURGICAL SUPPLY — 44 items
BLADE SURG 15 STRL LF DISP TIS (BLADE) ×1 IMPLANT
BLADE SURG 15 STRL SS (BLADE) ×3
BNDG COHESIVE 4X5 TAN NS LF (GAUZE/BANDAGES/DRESSINGS) ×3 IMPLANT
BNDG COHESIVE 6X5 TAN STRL LF (GAUZE/BANDAGES/DRESSINGS) IMPLANT
BNDG GAUZE ELAST 4 BULKY (GAUZE/BANDAGES/DRESSINGS) ×3 IMPLANT
COVER PERINEAL POST (MISCELLANEOUS) ×3 IMPLANT
COVER SURGICAL LIGHT HANDLE (MISCELLANEOUS) ×3 IMPLANT
DRAPE PROXIMA HALF (DRAPES) IMPLANT
DRAPE STERI IOBAN 125X83 (DRAPES) ×3 IMPLANT
DRSG MEPILEX BORDER 4X4 (GAUZE/BANDAGES/DRESSINGS) ×5 IMPLANT
DRSG MEPILEX BORDER 4X8 (GAUZE/BANDAGES/DRESSINGS) ×3 IMPLANT
DRSG PAD ABDOMINAL 8X10 ST (GAUZE/BANDAGES/DRESSINGS) ×6 IMPLANT
DURAPREP 26ML APPLICATOR (WOUND CARE) ×3 IMPLANT
ELECT CAUTERY BLADE 6.4 (BLADE) ×3 IMPLANT
ELECT REM PT RETURN 9FT ADLT (ELECTROSURGICAL) ×3
ELECTRODE REM PT RTRN 9FT ADLT (ELECTROSURGICAL) ×1 IMPLANT
FACESHIELD WRAPAROUND (MASK) ×3 IMPLANT
FACESHIELD WRAPAROUND OR TEAM (MASK) ×1 IMPLANT
GAUZE XEROFORM 5X9 LF (GAUZE/BANDAGES/DRESSINGS) ×3 IMPLANT
GLOVE NEODERM STRL 7.5 LF PF (GLOVE) ×2 IMPLANT
GLOVE SURG NEODERM 7.5  LF PF (GLOVE) ×4
GOWN STRL REIN XL XLG (GOWN DISPOSABLE) ×3 IMPLANT
GUIDE PIN 3.2X343 (PIN) ×1
GUIDE PIN 3.2X343MM (PIN) ×3
KIT BASIN OR (CUSTOM PROCEDURE TRAY) ×3 IMPLANT
KIT ROOM TURNOVER OR (KITS) ×3 IMPLANT
LINER BOOT UNIVERSAL DISP (MISCELLANEOUS) ×3 IMPLANT
MANIFOLD NEPTUNE II (INSTRUMENTS) ×3 IMPLANT
NAIL TRIGEN 10MMX36CM-125 RT (Nail) ×3 IMPLANT
NS IRRIG 1000ML POUR BTL (IV SOLUTION) ×3 IMPLANT
PACK GENERAL/GYN (CUSTOM PROCEDURE TRAY) ×3 IMPLANT
PAD ARMBOARD 7.5X6 YLW CONV (MISCELLANEOUS) ×6 IMPLANT
PAD CAST 4YDX4 CTTN HI CHSV (CAST SUPPLIES) ×2 IMPLANT
PADDING CAST COTTON 4X4 STRL (CAST SUPPLIES) ×6
PIN GUIDE 3.2X343MM (PIN) IMPLANT
SCREW LAG COMPR KIT 90/85 (Screw) ×2 IMPLANT
STAPLER VISISTAT 35W (STAPLE) ×3 IMPLANT
SUT VIC AB 0 CT1 27 (SUTURE) ×6
SUT VIC AB 0 CT1 27XBRD ANBCTR (SUTURE) ×2 IMPLANT
SUT VIC AB 2-0 CT1 27 (SUTURE) ×6
SUT VIC AB 2-0 CT1 TAPERPNT 27 (SUTURE) ×2 IMPLANT
TOWEL OR 17X24 6PK STRL BLUE (TOWEL DISPOSABLE) ×3 IMPLANT
TOWEL OR 17X26 10 PK STRL BLUE (TOWEL DISPOSABLE) ×3 IMPLANT
WATER STERILE IRR 1000ML POUR (IV SOLUTION) ×3 IMPLANT

## 2014-08-16 NOTE — H&P (Signed)
H&P update  The surgical history has been reviewed and remains accurate without interval change.  The patient was re-examined and patient's physiologic condition has not changed significantly in the last 30 days. The condition still exists that makes this procedure necessary. The treatment plan remains the same, without new options for care.  No new pharmacological allergies or types of therapy has been initiated that would change the plan or the appropriateness of the plan.  The patient and/or family understand the potential benefits and risks.  Mayra Reel, MD 08/16/2014 7:01 AM

## 2014-08-16 NOTE — Discharge Instructions (Signed)
1. Change dressings as needed 2. May shower but keep incisions covered and dry 3. Take plavix and aspirin to prevent blood clots 4. Take stool softeners as needed 5. Take pain meds as needed

## 2014-08-16 NOTE — Op Note (Signed)
   Date of Surgery: 08/16/2014  INDICATIONS: Michele Goodwin is a 81 y.o.-year-old female who was involved in an unwitnessed fall and sustained a right hip fracture (pertrochanteric-type). The risks and benefits of the procedure discussed with the family prior to the procedure and all questions were answered; consent was obtained.  PREOPERATIVE DIAGNOSIS: right hip fracture (intertrochanteric-type)   POSTOPERATIVE DIAGNOSIS: Same   PROCEDURE: Treatment of intertrochanteric fracture with intramedullary implant. CPT [phone]   SURGEON: N. Glee Arvin, M.D.   ANESTHESIA: general   IV FLUIDS AND URINE: See anesthesia record   ESTIMATED BLOOD LOSS: 100 cc  IMPLANTS: [employer] InterTAN 10 x 36, 90/85 screws  DRAINS: None.   COMPLICATIONS: None.   DESCRIPTION OF PROCEDURE: The patient was brought to the operating room and placed supine on the operating table. The patient's leg had been signed prior to the procedure. The patient had the anesthesia placed by the anesthesiologist. The prep verification and incision time-outs were performed to confirm that this was the correct patient, site, side and location. The patient had an SCD on the opposite lower extremity. The patient did receive antibiotics prior to the incision and was re-dosed during the procedure as needed at indicated intervals. The patient was positioned on the fracture table with the table in traction and internal rotation to reduce the hip. The well leg was placed in a scissor position and all bony prominences were well-padded. The patient had the lower extremity prepped and draped in the standard surgical fashion. The incision was made 4 finger breadths superior to the greater trochanter. A guide pin was inserted into the tip of the greater trochanter under fluoroscopic guidance. An opening reamer was used to gain access to the femoral canal. The nail length was measured and inserted down the femoral canal to its proper depth. The  appropriate version of insertion for the lag screw was found under fluoroscopy. A pin was inserted up the femoral neck through the jig. Then, a second antirotation pin was inserted inferior to the first pin. The length of the lag screw was then measured. The lag screw was inserted as near to center-center in the head as possible. The antirotation pin was then taken out and an interdigitating compression screw was placed in its place. The leg was taken out of traction, then the interdigitating compression screw was used to compress across the fracture. Compression was visualized on serial xrays. The wound was copiously irrigated with saline and the subcutaneous layer closed with 2.0 vicryl and the skin was reapproximated with staples. The wounds were cleaned and dried a final time and a sterile dressing was placed. The hip was taken through a range of motion at the end of the case under fluoroscopic imaging to visualize the approach-withdraw phenomenon and confirm implant length in the head. The patient was then awakened from anesthesia and taken to the recovery room in stable condition. All counts were correct at the end of the case.   POSTOPERATIVE PLAN: The patient will be weight bearing as tolerated and will return in 2 weeks for staple removal and the patient will receive DVT prophylaxis based on other medications, activity level, and risk ratio of bleeding to thrombosis.   Mayra Reel, MD [facility] [phone] 3:23 PM

## 2014-08-16 NOTE — Consult Note (Signed)
ORTHOPAEDIC CONSULTATION  REQUESTING PHYSICIAN: Standley Brooking, MD  Chief Complaint: Right hip fx  HPI: Michele Goodwin is a 81 y.o. female who was transferred here from [facility] for intertroch hip fx but was too high risk per anesthesia at [facility] secondary to her C2 fx and the need for intubation.  Patient is on plavix and not a candidate for spinal.  Patient has dementia and had unwitnessed fall at facility.  Does not recall fall.  Denies LOC/f/c/n/v.  Pain is severe with movement and does not radiate.  Ortho consulted for right hip fx.  Past Medical History  Diagnosis Date  . GERD (gastroesophageal reflux disease)   . Gastroparesis   . Parkinson's disease   . Burning mouth syndrome   . PUD (peptic ulcer disease)   . CVA (cerebral infarction)    Past Surgical History  Procedure Laterality Date  . Appendectomy    . Tonsillectomy    . Colonoscopy    . Upper gastrointestinal endoscopy     History   Social History  . Marital Status: Widowed    Spouse Name: N/A  . Number of Children: N/A  . Years of Education: N/A   Social History Main Topics  . Smoking status: Never Smoker   . Smokeless tobacco: Never Used  . Alcohol Use: No  . Drug Use: No  . Sexual Activity: Not on file   Other Topics Concern  . None   Social History Narrative   Family History  Problem Relation Age of Onset  . Colon cancer Mother   . Heart disease Father   . Healthy Son    Allergies  Allergen Reactions  . Codeine   . Demerol   . Morphine And Related    Prior to Admission medications   Medication Sig Start Date End Date Taking? Authorizing Provider  beclomethasone (BECONASE-AQ) 42 MCG/SPRAY nasal spray Place 2 sprays into both nostrils daily. Dose is for each nostril.   Yes Historical Provider, MD  carbamazepine (TEGRETOL XR) 100 MG 12 hr tablet Take 100 mg by mouth 2 (two) times daily.   Yes Historical Provider, MD  carbidopa-levodopa (SINEMET IR) 25-100 MG per tablet Take 1 tablet by mouth  4 (four) times daily.  07/31/14  Yes Historical Provider, MD  chlorthalidone (HYGROTON) 25 MG tablet Take 12.5 mg by mouth daily. 1/2 tablet daily per patient   Yes Historical Provider, MD  clopidogrel (PLAVIX) 75 MG tablet Take 75 mg by mouth daily.   Yes Historical Provider, MD  fesoterodine (TOVIAZ) 4 MG TB24 Take 4 mg by mouth daily.   Yes Historical Provider, MD  meclizine (ANTIVERT) 25 MG tablet Take 25 mg by mouth every 8 (eight) hours as needed for dizziness.    Yes Historical Provider, MD  naproxen sodium (ALEVE) 220 MG tablet Take 440 mg by mouth daily.   Yes Historical Provider, MD  pantoprazole (PROTONIX) 40 MG tablet Take 40 mg by mouth daily.   Yes Historical Provider, MD  PROAIR HFA 108 (90 BASE) MCG/ACT inhaler Inhale 1-2 puffs into the lungs every 6 (six) hours as needed for wheezing or shortness of breath.  07/29/14  Yes Historical Provider, MD   Dg Chest 1 View  08/14/2014   CLINICAL DATA:  Fall tonight with right hip pain.  EXAM: CHEST  1 VIEW  COMPARISON:  08/04/2014  FINDINGS: Lungs are adequately inflated without focal consolidation or effusion. No evidence pneumothorax. Mild stable cardiomegaly. Calcified plaque over the aortic arch. Biphasic curvature  of the thoracolumbar spine with degenerative changes throughout the spine. Moderate degenerative change of the shoulders.  IMPRESSION: No active disease.   Electronically Signed   By: Elberta Fortis M.D.   On: 08/14/2014 20:46   Ct Head Wo Contrast  08/14/2014   CLINICAL DATA:  Status post fall, with concern for head or cervical spine injury. Initial encounter.  EXAM: CT HEAD WITHOUT CONTRAST  CT CERVICAL SPINE WITHOUT CONTRAST  TECHNIQUE: Multidetector CT imaging of the head and cervical spine was performed following the standard protocol without intravenous contrast. Multiplanar CT image reconstructions of the cervical spine were also generated.  COMPARISON:  CT of the head performed 06/27/2014, CT of the cervical spine performed  02/26/2009, and MRI of the brain performed 04/03/2009  FINDINGS: CT HEAD FINDINGS  There is no evidence of acute infarction, mass lesion, or intra- or extra-axial hemorrhage on CT.  Prominence of the ventricles and sulci reflects moderate cortical volume loss. Diffuse periventricular and subcortical white matter change likely reflects small vessel ischemic microangiopathy. Chronic encephalomalacia at the high frontal lobes bilaterally may reflect remote infarct or traumatic injury. Cerebellar atrophy is noted.  The brainstem and fourth ventricle are within normal limits. The basal ganglia are unremarkable in appearance. No mass effect or midline shift is seen.  There is no evidence of fracture; visualized osseous structures are unremarkable in appearance. The orbits are within normal limits. The paranasal sinuses and mastoid air cells are well-aerated. Mild soft tissue swelling is noted overlying the right temporoparietal calvarium.  CT CERVICAL SPINE FINDINGS  There is no evidence of acute fracture or subluxation. There appears to be a chronic displaced and angulated fracture of the base of the dens, with associated osseous erosions involving both the dens and the remainder of C2. This is new from 2011. There is chronic osseous fusion at C3-C6, with surrounding disc osteophyte complexes, and chronic disc space narrowing along the remainder of the cervical and upper thoracic spine. There is grade 2 anterolisthesis of C7 on T1, reflecting underlying facet disease. Prevertebral soft tissues are within normal limits.  The left mandibular condyle is only minimally visualized on this study, possibly reflecting positioning.  A small calcification is noted at the left thyroid lobe. The visualized lung apices are clear. Scattered calcification is noted at the carotid bifurcations bilaterally.  IMPRESSION: 1. No evidence of traumatic intracranial injury or fracture. 2. No evidence of acute fracture or subluxation along the  cervical spine. 3. Apparent chronic displaced and angulated fracture of the base of the dens, with associated osseous erosions involving both the dens and the remainder of C2, new from 2011. 4. Mild soft tissue swelling overlying the right temporal parietal calvarium. 5. Moderate cortical volume loss and diffuse small vessel ischemic microangiopathy. 6. Chronic encephalomalacia at the high frontal lobes bilaterally may reflect remote infarct or traumatic injury. 7. Chronic osseous fusion at C3-C6, with diffuse associated degenerative change along the cervical and upper thoracic spine. Chronic grade 2 anterolisthesis of C7 on T1 reflects underlying facet disease. 8. Scattered calcification at the carotid bifurcations bilaterally. Carotid ultrasound could be considered for further evaluation, when and as deemed clinically appropriate.   Electronically Signed   By: Roanna Raider M.D.   On: 08/14/2014 21:24   Ct Cervical Spine Wo Contrast  08/14/2014   CLINICAL DATA:  Status post fall, with concern for head or cervical spine injury. Initial encounter.  EXAM: CT HEAD WITHOUT CONTRAST  CT CERVICAL SPINE WITHOUT CONTRAST  TECHNIQUE: Multidetector CT  imaging of the head and cervical spine was performed following the standard protocol without intravenous contrast. Multiplanar CT image reconstructions of the cervical spine were also generated.  COMPARISON:  CT of the head performed 06/27/2014, CT of the cervical spine performed 02/26/2009, and MRI of the brain performed 04/03/2009  FINDINGS: CT HEAD FINDINGS  There is no evidence of acute infarction, mass lesion, or intra- or extra-axial hemorrhage on CT.  Prominence of the ventricles and sulci reflects moderate cortical volume loss. Diffuse periventricular and subcortical white matter change likely reflects small vessel ischemic microangiopathy. Chronic encephalomalacia at the high frontal lobes bilaterally may reflect remote infarct or traumatic injury. Cerebellar  atrophy is noted.  The brainstem and fourth ventricle are within normal limits. The basal ganglia are unremarkable in appearance. No mass effect or midline shift is seen.  There is no evidence of fracture; visualized osseous structures are unremarkable in appearance. The orbits are within normal limits. The paranasal sinuses and mastoid air cells are well-aerated. Mild soft tissue swelling is noted overlying the right temporoparietal calvarium.  CT CERVICAL SPINE FINDINGS  There is no evidence of acute fracture or subluxation. There appears to be a chronic displaced and angulated fracture of the base of the dens, with associated osseous erosions involving both the dens and the remainder of C2. This is new from 2011. There is chronic osseous fusion at C3-C6, with surrounding disc osteophyte complexes, and chronic disc space narrowing along the remainder of the cervical and upper thoracic spine. There is grade 2 anterolisthesis of C7 on T1, reflecting underlying facet disease. Prevertebral soft tissues are within normal limits.  The left mandibular condyle is only minimally visualized on this study, possibly reflecting positioning.  A small calcification is noted at the left thyroid lobe. The visualized lung apices are clear. Scattered calcification is noted at the carotid bifurcations bilaterally.  IMPRESSION: 1. No evidence of traumatic intracranial injury or fracture. 2. No evidence of acute fracture or subluxation along the cervical spine. 3. Apparent chronic displaced and angulated fracture of the base of the dens, with associated osseous erosions involving both the dens and the remainder of C2, new from 2011. 4. Mild soft tissue swelling overlying the right temporal parietal calvarium. 5. Moderate cortical volume loss and diffuse small vessel ischemic microangiopathy. 6. Chronic encephalomalacia at the high frontal lobes bilaterally may reflect remote infarct or traumatic injury. 7. Chronic osseous fusion at  C3-C6, with diffuse associated degenerative change along the cervical and upper thoracic spine. Chronic grade 2 anterolisthesis of C7 on T1 reflects underlying facet disease. 8. Scattered calcification at the carotid bifurcations bilaterally. Carotid ultrasound could be considered for further evaluation, when and as deemed clinically appropriate.   Electronically Signed   By: Roanna Raider M.D.   On: 08/14/2014 21:24   Dg Hip Unilat With Pelvis 2-3 Views Right  08/14/2014   CLINICAL DATA:  Fall at nursing facility 10 night. Right hip pain and visible deformity. Initial encounter.  EXAM: DG HIP (WITH OR WITHOUT PELVIS) 2-3V RIGHT  COMPARISON:  None.  FINDINGS: A comminuted intertrochanteric right hip fracture is seen, with medial angulation of the distal fracture fragment. No evidence of hip dislocation. No acetabular or pelvic fracture identified. Generalized osteopenia noted.  IMPRESSION: Comminuted intertrochanteric right hip fracture.   Electronically Signed   By: Myles Rosenthal M.D.   On: 08/14/2014 20:46    Positive ROS: All other systems have been reviewed and were otherwise negative with the exception of those mentioned in  the HPI and as above.  Physical Exam: General: Alert, no acute distress Cardiovascular: No pedal edema Respiratory: No cyanosis, no use of accessory musculature GI: No organomegaly, abdomen is soft and non-tender Skin: No lesions in the area of chief complaint Neurologic: Sensation intact distally Psychiatric: Patient is pleasant Lymphatic: No axillary or cervical lymphadenopathy  MUSCULOSKELETAL:  - severe pain with hip ROM - NVI - 2+ distal pulses - compartments soft  Assessment: Right IT hip fx  Plan: - c collar on at all times - appreciate NSU recs - patient has been medically optimized for surgery - to surgery this pm - continue NPO - SNF post hospital dc  Thank you for the consult and the opportunity to see Ms. Helfman  N. Glee Arvin, MD [facility]  Orthopedics [phone] 7:25 AM

## 2014-08-16 NOTE — Anesthesia Postprocedure Evaluation (Signed)
  Anesthesia Post-op Note  Patient: Michele Goodwin  Procedure(s) Performed: Procedure(s): RIGHT INTRAMEDULLARY (IM) NAIL INTERTROCHANTERIC HIP FRACTURE (Right)  Patient Location: PACU  Anesthesia Type:General  Level of Consciousness: sleeping comfortably, awakes to stimulation (hearing impaired)  Airway and Oxygen Therapy: Patient Spontanous Breathing  Post-op Pain: mild  Post-op Assessment: Post-op Vital signs reviewed and Patient's Cardiovascular Status Stable LLE Motor Response: Purposeful movement   RLE Motor Response: Purposeful movement, Responds to commands RLE Sensation: No numbness, No pain, No tingling      Post-op Vital Signs: Reviewed  Last Vitals:  Filed Vitals:   08/16/14 1615  BP:   Pulse: 97  Temp:   Resp: 18    Complications: No apparent anesthesia complications

## 2014-08-16 NOTE — Progress Notes (Signed)
Patient ID: Michele Goodwin, female   DOB: March 02, 1917, 81 y.o.   MRN: [id number] BP 105/50 mmHg  Pulse 95  Temp(Src) 98.8 F (37.1 C) (Oral)  Resp 17  Ht  (1.575 m)  Wt 54.432 kg (120 lb)  BMI 21.94 kg/m2  SpO2 96% Alert, speech is clear. Perseverating tonight Moving all extremities Collar in place. Must remain in collar.

## 2014-08-16 NOTE — Progress Notes (Signed)
PROGRESS NOTE  Michele Goodwin WUJ:[id number] DOB: [birth year] DOA: 08/14/2014 PCP: Ignatius Specking., MD  HPI: 81 yow history of fall presented with hip injury. Imaging revealed right hip fracture. Furthering imaging revealed subacute fracture of dens, no known previous neck injury with history of previous fall. Scalp laceration repaired and tetanus updated. Dr. Lajean Saver discussed with Neurosurgeon Dr. Franky Macho who recommended MRI.  Subjective / 24 H Interval events - She has no complaints this morning, no chest pain, shortness of breath - She is alert and oriented to person, place and situation  Assessment/Plan: Principal Problem:   Intertrochanteric fracture of right hip Active Problems:   GERD (gastroesophageal reflux disease)   C2 cervical fracture   Right hip fracture s/p fall.  -  patient tells me that she was in her assisted living, turn her body to pick something up and fell on the floor. She denies any syncopal or presyncopal episodes, no chest palpitations  - Continue pain control. Management per orthopedics. C2 cervical spine fracture suspected chronic.  - MRI recommended.  - Currently in c-collar. - Recommend obtaining MRI once right hip fracture was repaired - Dr. Franky Macho with neurosurgery consulted, appreciate input Scalp laceration s/p fall.  - Repaired in ED. Will removed stiches 08/20/14. Parkinson's disease. - Continue Sinemet. History of TIA.  - Continue Plavix. Burning mouth syndrome.  - Continue Tegretol.  Diet: Diet NPO time specified Except for: Ice Chips, Sips with Meds Fluids: NS DVT Prophylaxis: per ortho  Code Status: DNR Family Communication: no family bedside  Disposition Plan: OR today  Consultants:  Orthopedic surgery   Neurosurgery   Procedures:  Hip repair 8/5   Antibiotics  Anti-infectives    None       Studies  Dg Chest 1 View  08/14/2014   CLINICAL DATA:  Fall tonight with right hip pain.  EXAM: CHEST  1 VIEW  COMPARISON:   08/04/2014  FINDINGS: Lungs are adequately inflated without focal consolidation or effusion. No evidence pneumothorax. Mild stable cardiomegaly. Calcified plaque over the aortic arch. Biphasic curvature of the thoracolumbar spine with degenerative changes throughout the spine. Moderate degenerative change of the shoulders.  IMPRESSION: No active disease.   Electronically Signed   By: Elberta Fortis M.D.   On: 08/14/2014 20:46   Ct Head Wo Contrast  08/14/2014   CLINICAL DATA:  Status post fall, with concern for head or cervical spine injury. Initial encounter.  EXAM: CT HEAD WITHOUT CONTRAST  CT CERVICAL SPINE WITHOUT CONTRAST  TECHNIQUE: Multidetector CT imaging of the head and cervical spine was performed following the standard protocol without intravenous contrast. Multiplanar CT image reconstructions of the cervical spine were also generated.  COMPARISON:  CT of the head performed 06/27/2014, CT of the cervical spine performed 02/26/2009, and MRI of the brain performed 04/03/2009  FINDINGS: CT HEAD FINDINGS  There is no evidence of acute infarction, mass lesion, or intra- or extra-axial hemorrhage on CT.  Prominence of the ventricles and sulci reflects moderate cortical volume loss. Diffuse periventricular and subcortical white matter change likely reflects small vessel ischemic microangiopathy. Chronic encephalomalacia at the high frontal lobes bilaterally may reflect remote infarct or traumatic injury. Cerebellar atrophy is noted.  The brainstem and fourth ventricle are within normal limits. The basal ganglia are unremarkable in appearance. No mass effect or midline shift is seen.  There is no evidence of fracture; visualized osseous structures are unremarkable in appearance. The orbits are within normal limits. The paranasal sinuses and mastoid air cells  are well-aerated. Mild soft tissue swelling is noted overlying the right temporoparietal calvarium.  CT CERVICAL SPINE FINDINGS  There is no evidence of  acute fracture or subluxation. There appears to be a chronic displaced and angulated fracture of the base of the dens, with associated osseous erosions involving both the dens and the remainder of C2. This is new from 2011. There is chronic osseous fusion at C3-C6, with surrounding disc osteophyte complexes, and chronic disc space narrowing along the remainder of the cervical and upper thoracic spine. There is grade 2 anterolisthesis of C7 on T1, reflecting underlying facet disease. Prevertebral soft tissues are within normal limits.  The left mandibular condyle is only minimally visualized on this study, possibly reflecting positioning.  A small calcification is noted at the left thyroid lobe. The visualized lung apices are clear. Scattered calcification is noted at the carotid bifurcations bilaterally.  IMPRESSION: 1. No evidence of traumatic intracranial injury or fracture. 2. No evidence of acute fracture or subluxation along the cervical spine. 3. Apparent chronic displaced and angulated fracture of the base of the dens, with associated osseous erosions involving both the dens and the remainder of C2, new from 2011. 4. Mild soft tissue swelling overlying the right temporal parietal calvarium. 5. Moderate cortical volume loss and diffuse small vessel ischemic microangiopathy. 6. Chronic encephalomalacia at the high frontal lobes bilaterally may reflect remote infarct or traumatic injury. 7. Chronic osseous fusion at C3-C6, with diffuse associated degenerative change along the cervical and upper thoracic spine. Chronic grade 2 anterolisthesis of C7 on T1 reflects underlying facet disease. 8. Scattered calcification at the carotid bifurcations bilaterally. Carotid ultrasound could be considered for further evaluation, when and as deemed clinically appropriate.   Electronically Signed   By: Roanna Raider M.D.   On: 08/14/2014 21:24   Ct Cervical Spine Wo Contrast  08/14/2014   CLINICAL DATA:  Status post fall,  with concern for head or cervical spine injury. Initial encounter.  EXAM: CT HEAD WITHOUT CONTRAST  CT CERVICAL SPINE WITHOUT CONTRAST  TECHNIQUE: Multidetector CT imaging of the head and cervical spine was performed following the standard protocol without intravenous contrast. Multiplanar CT image reconstructions of the cervical spine were also generated.  COMPARISON:  CT of the head performed 06/27/2014, CT of the cervical spine performed 02/26/2009, and MRI of the brain performed 04/03/2009  FINDINGS: CT HEAD FINDINGS  There is no evidence of acute infarction, mass lesion, or intra- or extra-axial hemorrhage on CT.  Prominence of the ventricles and sulci reflects moderate cortical volume loss. Diffuse periventricular and subcortical white matter change likely reflects small vessel ischemic microangiopathy. Chronic encephalomalacia at the high frontal lobes bilaterally may reflect remote infarct or traumatic injury. Cerebellar atrophy is noted.  The brainstem and fourth ventricle are within normal limits. The basal ganglia are unremarkable in appearance. No mass effect or midline shift is seen.  There is no evidence of fracture; visualized osseous structures are unremarkable in appearance. The orbits are within normal limits. The paranasal sinuses and mastoid air cells are well-aerated. Mild soft tissue swelling is noted overlying the right temporoparietal calvarium.  CT CERVICAL SPINE FINDINGS  There is no evidence of acute fracture or subluxation. There appears to be a chronic displaced and angulated fracture of the base of the dens, with associated osseous erosions involving both the dens and the remainder of C2. This is new from 2011. There is chronic osseous fusion at C3-C6, with surrounding disc osteophyte complexes, and chronic disc space narrowing  along the remainder of the cervical and upper thoracic spine. There is grade 2 anterolisthesis of C7 on T1, reflecting underlying facet disease. Prevertebral  soft tissues are within normal limits.  The left mandibular condyle is only minimally visualized on this study, possibly reflecting positioning.  A small calcification is noted at the left thyroid lobe. The visualized lung apices are clear. Scattered calcification is noted at the carotid bifurcations bilaterally.  IMPRESSION: 1. No evidence of traumatic intracranial injury or fracture. 2. No evidence of acute fracture or subluxation along the cervical spine. 3. Apparent chronic displaced and angulated fracture of the base of the dens, with associated osseous erosions involving both the dens and the remainder of C2, new from 2011. 4. Mild soft tissue swelling overlying the right temporal parietal calvarium. 5. Moderate cortical volume loss and diffuse small vessel ischemic microangiopathy. 6. Chronic encephalomalacia at the high frontal lobes bilaterally may reflect remote infarct or traumatic injury. 7. Chronic osseous fusion at C3-C6, with diffuse associated degenerative change along the cervical and upper thoracic spine. Chronic grade 2 anterolisthesis of C7 on T1 reflects underlying facet disease. 8. Scattered calcification at the carotid bifurcations bilaterally. Carotid ultrasound could be considered for further evaluation, when and as deemed clinically appropriate.   Electronically Signed   By: Roanna Raider M.D.   On: 08/14/2014 21:24   Dg Hip Unilat With Pelvis 2-3 Views Right  08/14/2014   CLINICAL DATA:  Fall at nursing facility 10 night. Right hip pain and visible deformity. Initial encounter.  EXAM: DG HIP (WITH OR WITHOUT PELVIS) 2-3V RIGHT  COMPARISON:  None.  FINDINGS: A comminuted intertrochanteric right hip fracture is seen, with medial angulation of the distal fracture fragment. No evidence of hip dislocation. No acetabular or pelvic fracture identified. Generalized osteopenia noted.  IMPRESSION: Comminuted intertrochanteric right hip fracture.   Electronically Signed   By: Myles Rosenthal M.D.    On: 08/14/2014 20:46    Objective  Filed Vitals:   08/15/14 1941 08/16/14 0127 08/16/14 0551 08/16/14 1235  BP: 161/69 137/56 123/58 127/62  Pulse: 97 64 94 96  Temp: 98.9 F (37.2 C) 99.3 F (37.4 C) 98.9 F (37.2 C) 99.3 F (37.4 C)  TempSrc: Oral     Resp:  18 18 18   Height:      Weight:      SpO2: 96% 100% 98% 97%    Intake/Output Summary (Last 24 hours) at 08/16/14 1346 Last data filed at 08/16/14 1234  Gross per 24 hour  Intake    915 ml  Output    775 ml  Net    140 ml   Filed Weights   08/14/14 1953 08/14/14 1958  Weight: 54.432 kg (120 lb) 54.432 kg (120 lb)    Exam:  GENERAL: NAD  HEENT: Head is normocephalic. C colar in place. Extraocular muscles are intact. Pupils are equal, round, and reactive to light and accommodation. Mouth is well hydrated and without lesions. Mucous membranes are moist. Posterior pharynx clear of any exudate or lesions.  NECK: Supple. No carotid bruits. No lymphadenopathy or thyromegaly.  LUNGS: Clear to auscultation. No wheezing or crackles  HEART: Regular rate and rhythm without murmur. 2+ pulses, no JVD, no peripheral edema  ABDOMEN: Soft, nontender, and nondistended. Positive bowel sounds.   EXTREMITIES: Without any cyanosis, clubbing, rash, lesions or edema.  NEUROLOGIC: Aert and oriented x3. Cranial nerves II through XII are grossly intact. Strength 5/5 in all 4.  PSYCHIATRIC: Normal mood and affect  SKIN: No rashes  Data Reviewed: Basic Metabolic Panel:  Recent Labs Lab 08/14/14 2042 08/15/14 0539  NA 134* 133*  K 4.4 4.1  CL 98* 98*  CO2 26 28  GLUCOSE 126* 101*  BUN 27* 24*  CREATININE 1.01* 0.84  CALCIUM 8.5* 8.2*   CBC:  Recent Labs Lab 08/14/14 2042 08/15/14 0539 08/15/14 2318  WBC 13.8* 10.4 11.6*  NEUTROABS 11.0*  --  9.4*  HGB 10.8* 8.8* 11.7*  HCT 33.2* 27.1* 33.4*  MCV 86.5 86.6 85.4  PLT 378 365 281   CBG: No results for input(s): GLUCAP in the last 168 hours.  Recent Results  (from the past 240 hour(s))  MRSA PCR Screening     Status: None   Collection Time: 08/15/14  2:20 AM  Result Value Ref Range Status   MRSA by PCR NEGATIVE NEGATIVE Final    Comment:        The GeneXpert MRSA Assay (FDA approved for NASAL specimens only), is one component of a comprehensive MRSA colonization surveillance program. It is not intended to diagnose MRSA infection nor to guide or monitor treatment for MRSA infections.   Surgical pcr screen     Status: None   Collection Time: 08/16/14  5:24 AM  Result Value Ref Range Status   MRSA, PCR NEGATIVE NEGATIVE Final   Staphylococcus aureus NEGATIVE NEGATIVE Final    Comment:        The Xpert SA Assay (FDA approved for NASAL specimens in patients over 97 years of age), is one component of a comprehensive surveillance program.  Test performance has been validated by [facility] for patients greater than or equal to 51 year old. It is not intended to diagnose infection nor to guide or monitor treatment.      Scheduled Meds: . [MAR Hold] antiseptic oral rinse  7 mL Mouth Rinse BID  . [MAR Hold] carbamazepine  100 mg Oral BID  . [MAR Hold] carbidopa-levodopa  1 tablet Oral QID  . [MAR Hold] fesoterodine  4 mg Oral Daily  . [MAR Hold] pantoprazole  40 mg Oral Daily  . [MAR Hold] polyethylene glycol  17 g Oral BID   Continuous Infusions: . sodium chloride 50 mL/hr at 08/15/14 2054  . lactated ringers 10 mL/hr at 08/16/14 1321    Time spent coordinating care: 25 minutes, greater than 50% of this time was spent in counseling, explanation of diagnosis, planning of further management, coordination of care. All images within past 24 hours personally reviewed.   Pamella Pert, MD [facility] Pager [phone]. If 7 PM - 7 AM, please contact night-coverage at [email or web address], password [facility] 08/16/2014, 1:46 PM  LOS: 2 days

## 2014-08-16 NOTE — Transfer of Care (Signed)
Immediate Anesthesia Transfer of Care Note  Patient: Michele Goodwin  Procedure(s) Performed: Procedure(s): RIGHT INTRAMEDULLARY (IM) NAIL INTERTROCHANTERIC HIP FRACTURE (Right)  Patient Location: PACU  Anesthesia Type:General  Level of Consciousness: awake and sedated  Airway & Oxygen Therapy: Patient Spontanous Breathing and Patient connected to nasal cannula oxygen  Post-op Assessment: Report given to RN, Post -op Vital signs reviewed and stable and Patient moving all extremities X 4  Post vital signs: Reviewed and stable  Last Vitals:  Filed Vitals:   08/16/14 1235  BP: 127/62  Pulse: 96  Temp: 37.4 C  Resp: 18    Complications: No apparent anesthesia complications

## 2014-08-16 NOTE — Anesthesia Procedure Notes (Signed)
Procedure Name: Intubation Date/Time: 08/16/2014 2:24 PM Performed by: Rise Patience T Pre-anesthesia Checklist: Patient identified, Emergency Drugs available, Suction available and Patient being monitored Patient Re-evaluated:Patient Re-evaluated prior to inductionOxygen Delivery Method: Circle system utilized Preoxygenation: Pre-oxygenation with 100% oxygen Intubation Type: IV induction Ventilation: Mask ventilation without difficulty Laryngoscope Size: Glidescope and 3 Grade View: Grade I Tube type: Oral Tube size: 7.5 mm Number of attempts: 1 Airway Equipment and Method: Stylet and Video-laryngoscopy Placement Confirmation: ETT inserted through vocal cords under direct vision,  positive ETCO2 and breath sounds checked- equal and bilateral Secured at: 22 cm Tube secured with: Tape Dental Injury: Teeth and Oropharynx as per pre-operative assessment

## 2014-08-16 NOTE — Anesthesia Preprocedure Evaluation (Addendum)
Anesthesia Evaluation  Patient identified by MRN, date of birth, ID band Patient awake    Reviewed: Allergy & Precautions, NPO status , Patient's Chart, lab work & pertinent test results  Airway Mallampati: II   Neck ROM: Limited  Mouth opening: Limited Mouth Opening Comment: Limited mouth opening 2/2 collar  Dental  (+) Dental Advisory Given   Pulmonary  breath sounds clear to auscultation        Cardiovascular Rhythm:Regular Rate:Normal     Neuro/Psych CVA    GI/Hepatic PUD, GERD-  Medicated,  Endo/Other    Renal/GU      Musculoskeletal   Abdominal   Peds  Hematology   Anesthesia Other Findings   Reproductive/Obstetrics                            Anesthesia Physical Anesthesia Plan  ASA: III  Anesthesia Plan: General   Post-op Pain Management:    Induction: Intravenous  Airway Management Planned: Oral ETT and Video Laryngoscope Planned  Additional Equipment:   Intra-op Plan:   Post-operative Plan: Extubation in OR  Informed Consent: I have reviewed the patients History and Physical, chart, labs and discussed the procedure including the risks, benefits and alternatives for the proposed anesthesia with the patient or authorized representative who has indicated his/her understanding and acceptance.   Dental advisory given  Plan Discussed with: CRNA, Anesthesiologist and Surgeon  Anesthesia Plan Comments: (Anesthetic plan discussed in detail. Associated risk discussed including but not limited to life threatening cardiovascular, pulmonary events and dental damage. The postoperative pain management and antiemetic plan discussed with patient. All questions answered in detail. Patient is in agreement.    Consent discussed with Son and Daughter in [place])       Anesthesia Quick Evaluation

## 2014-08-16 NOTE — Care Management Important Message (Signed)
Important Message  Patient Details  Name: Michele Goodwin MRN: [id number] Date of Birth: 1917-05-05   Medicare Important Message Given:  Yes-second notification given    Orson Aloe 08/16/2014, 1:55 PM

## 2014-08-17 DIAGNOSIS — S0101XA Laceration without foreign body of scalp, initial encounter: Secondary | ICD-10-CM

## 2014-08-17 DIAGNOSIS — S12100S Unspecified displaced fracture of second cervical vertebra, sequela: Secondary | ICD-10-CM

## 2014-08-17 DIAGNOSIS — W19XXXA Unspecified fall, initial encounter: Secondary | ICD-10-CM

## 2014-08-17 DIAGNOSIS — G20 Parkinson's disease: Secondary | ICD-10-CM

## 2014-08-17 LAB — BASIC METABOLIC PANEL
Anion gap: 8 (ref 5–15)
BUN: 20 mg/dL (ref 6–20)
CO2: 22 mmol/L (ref 22–32)
CREATININE: 0.99 mg/dL (ref 0.44–1.00)
Calcium: 7.6 mg/dL — ABNORMAL LOW (ref 8.9–10.3)
Chloride: 102 mmol/L (ref 101–111)
GFR, EST AFRICAN AMERICAN: 55 mL/min — AB (ref >60)
GFR, EST NON AFRICAN AMERICAN: 47 mL/min — AB (ref >60)
Glucose, Bld: 114 mg/dL — ABNORMAL HIGH (ref 65–99)
Potassium: 3.7 mmol/L (ref 3.5–5.1)
Sodium: 132 mmol/L — ABNORMAL LOW (ref 135–145)

## 2014-08-17 LAB — CBC
HCT: 29.1 % — ABNORMAL LOW (ref 36.0–46.0)
Hemoglobin: 9.7 g/dL — ABNORMAL LOW (ref 12.0–15.0)
MCH: 29 pg (ref 26.0–34.0)
MCHC: 33.3 g/dL (ref 30.0–36.0)
MCV: 86.9 fL (ref 78.0–100.0)
Platelets: 238 10*3/uL (ref 150–400)
RBC: 3.35 MIL/uL — AB (ref 3.87–5.11)
RDW: 14.4 % (ref 11.5–15.5)
WBC: 12.3 10*3/uL — AB (ref 4.0–10.5)

## 2014-08-17 MED ORDER — ENOXAPARIN SODIUM 30 MG/0.3ML ~~LOC~~ SOLN
30.0000 mg | SUBCUTANEOUS | Status: DC
  Administered 2014-08-18 – 2014-08-21 (×4): 30 mg via SUBCUTANEOUS
  Filled 2014-08-17 (×4): qty 0.3

## 2014-08-17 NOTE — Progress Notes (Signed)
[facility] PROGRESS NOTE  HARMONEY SIENKIEWICZ MVH:[id number] DOB: October 29, 1917 DOA: 08/14/2014 PCP: Ignatius Specking., MD HPI/Subjective: 81 yo female PMHx  Peptic ulcer, gastroparesis, Parkinson's disease, CVA,Burning Mouth Syndrome    Hx of fall presented with hip injury. Imaging revealed right hip fracture. Furthering imaging revealed subacute fracture of dens, no known previous neck injury with history of previous fall. Scalp laceration repaired and tetanus updated. Dr. Lajean Saver discussed with Neurosurgeon Dr. Franky Macho who recommended MRI.      Assessment/Plan: Right hip fracture s/p fall.  - patient tells me that she was in her assisted living, turn her body to pick something up and fell on the floor. She denies any syncopal or presyncopal episodes, no chest palpitations  - Continue pain control. Management per orthopedics.  C2 cervical spine fracture suspected chronic.  - MRI recommended.  - Currently in c-collar. - Recommend obtaining MRI once right hip fracture was repaired  Scalp laceration s/p fall.  - Repaired in ED. Will removed stiches 08/20/14.  Anemia/Acute Blood Loss Anemia? -May be partially dilutional, will decrease patient's normal saline to 7ml/hr -Hemoglobin dropped from 11.8--> 9.7 overnight. -Occult blood -Transfuse for hemoglobin <7   Parkinson's disease. - Continue Sinemet.  History of TIA.  - Continue Plavix.  Burning mouth syndrome.  - Continue Tegretol    Code Status: Full Family Communication: Son Disposition Plan: PT/OT recommends SNF   Consultants: Dr.Mark Becky Sax Orthopedic surgery  Dr. Franky Macho (Neurosurgery)   Procedures: 8/3 x-ray hip with pelvis;Comminuted intertrochanteric right hip fracture 8/3 CT C-spine without contrast;.- chronic displaced and angulated fracture base dens, with associated osseous erosions involving both the dens/remainder of C2, new from 2011. - Chronic encephalomalacia high frontal lobes bilaterally  remote infarct vs traumatic injury. - Chronic osseous fusion at C3-C6, w/ diffuse associated degenerative change along cervical and upper thoracic spine. -Chronic grade 2 anterolisthesis of C7 on T1  -Scattered calcification at carotid bifurcations bilaterally. 8/5 Treatment of Rt intertrochanteric fracture with intramedullary implant. CPT [phone]     Cultures   Antibiotics: Ancef 8/53 doses   DVT prophylaxis Lovenox    Objective: Filed Vitals:   08/17/14 0534 08/17/14 1414 08/17/14 2225 08/18/14 0653  BP: 96/46 112/56 103/47 124/49  Pulse: 98 82 96 94  Temp: 98.8 F (37.1 C) 98.4 F (36.9 C) 98.3 F (36.8 C) 99.1 F (37.3 C)  TempSrc: Oral  Oral Oral  Resp: 17 16 16 16   Height:      Weight:      SpO2: 96% 97% 95% 93%    Intake/Output Summary (Last 24 hours) at 08/18/14 0747 Last data filed at 08/17/14 1721  Gross per 24 hour  Intake    360 ml  Output      0 ml  Net    360 ml   Filed Weights   08/14/14 1953 08/14/14 1958  Weight: 54.432 kg (120 lb) 54.432 kg (120 lb)     Exam: General: A/O 3 (does not recall why), NAD, No acute respiratory distress, staples 4 right scalp with negative sign hematoma  Eyes: Negative headache, eye pain, double vision,negative scleral hemorrhage ENT: Negative Runny nose, negative ear pain, negative tinnitus, negative gingival bleeding Neck:  Negative scars, masses, torticollis, lymphadenopathy, JVD Lungs: Clear to auscultation bilaterally without wheezes or crackles Cardiovascular: Regular rate and rhythm without murmur gallop or rub normal S1 and S2 Abdomen:negative abdominal pain, negative dysphagia, nondistended, positive soft, bowel sounds, no rebound, no ascites, no appreciable mass Extremities: No significant cyanosis, clubbing, or  edema bilateral lower extremities, right hip negative hematoma, surgical incision sites covered and clean, negative sign of infection. Psychiatric:  Negative depression, negative anxiety,  negative fatigue, negative mania  Neurologic:  Cranial nerves II through XII intact, tongue/uvula midline, all extremities muscle strength 5/5, sensation intact throughout, finger nose finger bilateral within normal limits, quick finger touch bilateral within normal limits, negative dysarthria, negative expressive aphasia, negative receptive aphasia.     Data Reviewed: Basic Metabolic Panel:  Recent Labs Lab 08/14/14 2042 08/15/14 0539 08/16/14 1907 08/17/14 0400  NA 134* 133*  --  132*  K 4.4 4.1  --  3.7  CL 98* 98*  --  102  CO2 26 28  --  22  GLUCOSE 126* 101*  --  114*  BUN 27* 24*  --  20  CREATININE 1.01* 0.84 0.93 0.99  CALCIUM 8.5* 8.2*  --  7.6*   Liver Function Tests: No results for input(s): AST, ALT, ALKPHOS, BILITOT, PROT, ALBUMIN in the last 168 hours. No results for input(s): LIPASE, AMYLASE in the last 168 hours. No results for input(s): AMMONIA in the last 168 hours. CBC:  Recent Labs Lab 08/14/14 2042 08/15/14 0539 08/15/14 2318 08/16/14 1907 08/17/14 0400 08/18/14 0650  WBC 13.8* 10.4 11.6* 14.4* 12.3* 10.4  NEUTROABS 11.0*  --  9.4*  --   --   --   HGB 10.8* 8.8* 11.7* 11.8* 9.7* 8.6*  HCT 33.2* 27.1* 33.4* 34.8* 29.1* 25.9*  MCV 86.5 86.6 85.4 87.9 86.9 86.6  PLT 378 365 281 249 238 225   Cardiac Enzymes: No results for input(s): CKTOTAL, CKMB, CKMBINDEX, TROPONINI in the last 168 hours. BNP (last 3 results) No results for input(s): BNP in the last 8760 hours.  ProBNP (last 3 results) No results for input(s): PROBNP in the last 8760 hours.  CBG: No results for input(s): GLUCAP in the last 168 hours.  Recent Results (from the past 240 hour(s))  MRSA PCR Screening     Status: None   Collection Time: 08/15/14  2:20 AM  Result Value Ref Range Status   MRSA by PCR NEGATIVE NEGATIVE Final    Comment:        The GeneXpert MRSA Assay (FDA approved for NASAL specimens only), is one component of a comprehensive MRSA  colonization surveillance program. It is not intended to diagnose MRSA infection nor to guide or monitor treatment for MRSA infections.   Surgical pcr screen     Status: None   Collection Time: 08/16/14  5:24 AM  Result Value Ref Range Status   MRSA, PCR NEGATIVE NEGATIVE Final   Staphylococcus aureus NEGATIVE NEGATIVE Final    Comment:        The Xpert SA Assay (FDA approved for NASAL specimens in patients over 57 years of age), is one component of a comprehensive surveillance program.  Test performance has been validated by [facility] for patients greater than or equal to 84 year old. It is not intended to diagnose infection nor to guide or monitor treatment.      Studies: Dg Hip Operative Unilat With Pelvis Right  08/16/2014   CLINICAL DATA:  Right intra medullary nail, intertrochanteric hip fracture.  EXAM: OPERATIVE RIGHT HIP (WITH PELVIS IF PERFORMED) 2 VIEWS  TECHNIQUE: Fluoroscopic spot image(s) were submitted for interpretation post-operatively.  COMPARISON:  08/14/2014  FINDINGS: Status post internal fixation across the right femoral intertrochanteric fracture. Near anatomic alignment. No hardware or bony complicating feature.  IMPRESSION: Internal fixation of the right  intertrochanteric fracture. No complicating feature.   Electronically Signed   By: Charlett Nose M.D.   On: 08/16/2014 15:36    Scheduled Meds: . antiseptic oral rinse  7 mL Mouth Rinse BID  . carbamazepine  100 mg Oral BID  . carbidopa-levodopa  1 tablet Oral QID  . enoxaparin (LOVENOX) injection  30 mg Subcutaneous Q24H  . fesoterodine  4 mg Oral Daily  . pantoprazole  40 mg Oral Daily  . polyethylene glycol  17 g Oral BID   Continuous Infusions: . sodium chloride 75 mL/hr at 08/18/14 0454  . lactated ringers 10 mL/hr at 08/16/14 1321    Principal Problem:   Intertrochanteric fracture of right hip Active Problems:   GERD (gastroesophageal reflux disease)   C2 cervical fracture   Dens  fracture   Fall   Fracture, intertrochanteric, right femur   Parkinson's disease   Acute blood loss anemia   Scalp laceration    Time spent: 36 min    WOODS, CURTIS J  [facility] Pager [phone]. If 7PM-7AM, please contact night-coverage at [email or web address], password [facility] 08/18/2014, 7:47 AM  LOS: 4 days    Care during the described time interval was provided by me .  I have reviewed this patient's available data, including medical history, events of note, physical examination, and all test results as part of my evaluation. I have personally reviewed and interpreted all radiology studies.   Carolyne Littles, MD [phone] Pager

## 2014-08-17 NOTE — Progress Notes (Signed)
Orthopedic Tech Progress Note Patient Details:  Michele Goodwin April 24, 1917 [id number] Patient unable to use [facility] Patient ID: Michele Goodwin, female   DOB: [birth year], 81 y.o.   MRN: [id number]   Michele Goodwin 08/17/2014, 12:11 AM

## 2014-08-17 NOTE — Progress Notes (Signed)
Occupational Therapy Evaluation Patient Details Name: Michele Goodwin MRN: [id number] DOB: [birth year] Today's Date: 08/17/2014    History of Present Illness Pt admitted s/p fall with right hip fx and C2 facture (suspected chronic).  Now s/p right IM nail.   Clinical Impression   Pt s/p right IM nail and is in cervical collar.   Prior to admission, she resided at assisted living and was able to walk to her meals in dining room.  Today required +2 total assist for bed mobility and tolerated 5 minutes sitting EOB with mod assist.  Very limited by pain.  Will continue to follow acutely in order to address below problem list.  Recommending SNF to further progress rehab and improve her overall safety and independence with ADLs.    Follow Up Recommendations  SNF    Equipment Recommendations   (TBD at next venue)    Recommendations for Other Services       Precautions / Restrictions Precautions Precautions: Fall Restrictions Weight Bearing Restrictions: Yes RLE Weight Bearing: Weight bearing as tolerated      Mobility Bed Mobility Overal bed mobility: +2 for physical assistance;Needs Assistance Bed Mobility: Supine to Sit;Sit to Supine     Supine to sit: +2 for physical assistance;Total assist Sit to supine: Total assist;+2 for physical assistance   General bed mobility comments: +2 assist to support trunk and LEs simutaneously while using draw pads to shift hips to/from EOB.  Transfers                      Balance Overall balance assessment: Needs assistance Sitting-balance support: Feet supported;Bilateral upper extremity supported Sitting balance-Leahy Scale: Poor Sitting balance - Comments: Patient unable to tolerate shifting weight to right hip while sitting EOB. Postural control: Left lateral lean                                  ADL Overall ADL's : Needs assistance/impaired         Upper Body Bathing: Moderate assistance;Sitting    Lower Body Bathing: Sitting/lateral leans;Maximal assistance   Upper Body Dressing : Moderate assistance;Sitting   Lower Body Dressing: Total assistance;Sitting/lateral leans                 General ADL Comments: Patient participated in sitting EOB for 5 minutes but was limited by pain and discomfort.       Vision     Perception     Praxis      Pertinent Vitals/Pain Pain Assessment: 0-10 Pain Score: 8  Pain Location: Generalized at rest and in right hip with movement Pain Descriptors / Indicators: Aching Pain Intervention(s): Limited activity within patient's tolerance;Repositioned     Hand Dominance     Extremity/Trunk Assessment Upper Extremity Assessment Upper Extremity Assessment: Overall WFL for tasks assessed;Generalized weakness           Communication Communication Communication: HOH   Cognition Arousal/Alertness: Awake/alert Behavior During Therapy: WFL for tasks assessed/performed Overall Cognitive Status: Within Functional Limits for tasks assessed                     General Comments       Exercises       Shoulder Instructions      Home Living Family/patient expects to be discharged to:: Skilled nursing facility  Additional Comments: Pt is from assisted living.      Prior Functioning/Environment Level of Independence: Needs assistance  Gait / Transfers Assistance Needed: Patient was using a rollator to walk to dining room.  Son would push her in w/c when going long distances and if going outside. ADL's / Homemaking Assistance Needed: Assisted living- staff helped with baths.         OT Diagnosis: Generalized weakness;Acute pain   OT Problem List: Decreased strength;Decreased activity tolerance;Impaired balance (sitting and/or standing);Decreased knowledge of use of DME or AE;Pain   OT Treatment/Interventions: Self-care/ADL training;DME and/or AE instruction;Therapeutic  activities;Patient/family education;Balance training    OT Goals(Current goals can be found in the care plan section) Acute Rehab OT Goals Patient Stated Goal: to be able to move with less pain OT Goal Formulation: With patient/family Time For Goal Achievement: 08/31/14 Potential to Achieve Goals: Good ADL Goals Pt Will Transfer to Toilet: with mod assist;squat pivot transfer;stand pivot transfer;bedside commode Additional ADL Goal #1: Pt will be able to perform all bed mobility with mod assist or less as precursor for EOB ADLs tasks. Additional ADL Goal #2: Patient will tolerate sitting EOB for 10 minutes with up to min assist for support while participating in grooming tasks.  OT Frequency: Min 2X/week   Barriers to D/C:            Co-evaluation PT/OT/SLP Co-Evaluation/Treatment: Yes Reason for Co-Treatment: For patient/therapist safety   OT goals addressed during session: ADL's and self-care (functional mobility)      End of Session Nurse Communication: Mobility status  Activity Tolerance: No increased pain Patient left: in bed;with call bell/phone within reach;with family/visitor present   Time: 1610-9604 OT Time Calculation (min): 23 min Charges:  OT General Charges $OT Visit: 1 Procedure OT Evaluation $Initial OT Evaluation Tier I: 1 Procedure G-Codes:    Cipriano Mile OTR/L 08/17/2014, 11:52 AM

## 2014-08-17 NOTE — Progress Notes (Signed)
Orthopedic Tech Progress Note Patient Details:  Michele Goodwin 09-Apr-1917 [id number]  Patient ID: [name], female   DOB: 1917/03/28, 81 y.o.   MRN: [id number] Pt unable to use trapeze bar patient helper  Nikki Dom 08/17/2014, 6:58 AM

## 2014-08-17 NOTE — Progress Notes (Signed)
Subjective: 1 Day Post-Op Procedure(s) (LRB): RIGHT INTRAMEDULLARY (IM) NAIL INTERTROCHANTERIC HIP FRACTURE (Right) Patient reports pain as moderate.    Objective: Vital signs in last 24 hours: Temp:  [98.4 F (36.9 C)-99.3 F (37.4 C)] 98.8 F (37.1 C) (08/06 0534) Pulse Rate:  [75-102] 98 (08/06 0534) Resp:  [16-26] 17 (08/06 0534) BP: (94-166)/(46-89) 96/46 mmHg (08/06 0534) SpO2:  [89 %-100 %] 96 % (08/06 0534)  Intake/Output from previous day: 08/05 0701 - 08/06 0700 In: 2095 [P.O.:120; I.V.:1975] Out: 1075 [Urine:1025; Blood:50] Intake/Output this shift:     Recent Labs  08/14/14 2042 08/15/14 0539 08/15/14 2318 08/16/14 1907 08/17/14 0400  HGB 10.8* 8.8* 11.7* 11.8* 9.7*    Recent Labs  08/16/14 1907 08/17/14 0400  WBC 14.4* 12.3*  RBC 3.96 3.35*  HCT 34.8* 29.1*  PLT 249 238    Recent Labs  08/15/14 0539 08/16/14 1907 08/17/14 0400  NA 133*  --  132*  K 4.1  --  3.7  CL 98*  --  102  CO2 28  --  22  BUN 24*  --  20  CREATININE 0.84 0.93 0.99  GLUCOSE 101*  --  114*  CALCIUM 8.2*  --  7.6*    Recent Labs  08/14/14 2042  INR 1.04    Neurologically intact  Assessment/Plan: 1 Day Post-Op Procedure(s) (LRB): RIGHT INTRAMEDULLARY (IM) NAIL INTERTROCHANTERIC HIP FRACTURE (Right) Up with therapy  Michele Goodwin C 08/17/2014, 10:41 AM

## 2014-08-17 NOTE — Evaluation (Signed)
Physical Therapy Evaluation Patient Details Name: Michele Goodwin MRN: [id number] DOB: 05-05-1917 Today's Date: 08/17/2014   History of Present Illness  Pt admitted s/p fall with right hip fx and C2 facture (suspected chronic).  Now s/p right IM nail.  Clinical Impression  Pt admitted with above diagnosis. Pt currently with functional limitations due to the deficits listed below (see PT Problem List). PTA pt resided in ALF. On eval, mobility was limited by pain. Pt will benefit from skilled PT to increase their independence and safety with mobility to allow discharge to the venue listed below.       Follow Up Recommendations SNF;Supervision/Assistance - 24 hour    Equipment Recommendations  Rolling walker with 5" wheels    Recommendations for Other Services       Precautions / Restrictions Precautions Precautions: Fall Required Braces or Orthoses: Cervical Brace Cervical Brace: Hard collar;At all times Restrictions Weight Bearing Restrictions: Yes RLE Weight Bearing: Weight bearing as tolerated      Mobility  Bed Mobility Overal bed mobility: +2 for physical assistance Bed Mobility: Supine to Sit;Sit to Supine     Supine to sit: +2 for physical assistance;Total assist Sit to supine: Total assist;+2 for physical assistance   General bed mobility comments: +2 assist to support trunk and LEs simutaneously while using draw pads to shift hips to/from EOB.  Transfers                 General transfer comment: Pt unable to tolerate progression passed EOB due to pain.  Ambulation/Gait                Stairs            Wheelchair Mobility    Modified Rankin (Stroke Patients Only)       Balance Overall balance assessment: Needs assistance Sitting-balance support: Feet supported;Bilateral upper extremity supported Sitting balance-Leahy Scale: Poor Sitting balance - Comments: Patient unable to tolerate shifting weight to right hip while sitting EOB. Pt  sat EOB x 5 minutes with Mod assist. Postural control: Left lateral lean                                   Pertinent Vitals/Pain Pain Assessment: 0-10 Pain Score: 8  Pain Location: Generalized at rest and in right hip with movement Pain Descriptors / Indicators: Aching Pain Intervention(s): Limited activity within patient's tolerance;Repositioned    Home Living Family/patient expects to be discharged to:: Skilled nursing facility                 Additional Comments: Pt is from assisted living.    Prior Function Level of Independence: Needs assistance   Gait / Transfers Assistance Needed: Patient was using a rollator to walk to dining room.  Son would push her in w/c when going long distances and if going outside.  ADL's / Homemaking Assistance Needed: Assisted living- staff helped with baths.         Hand Dominance        Extremity/Trunk Assessment   Upper Extremity Assessment: Overall WFL for tasks assessed;Generalized weakness                     Communication   Communication: HOH  Cognition Arousal/Alertness: Awake/alert Behavior During Therapy: WFL for tasks assessed/performed Overall Cognitive Status: Within Functional Limits for tasks assessed  General Comments      Exercises        Assessment/Plan    PT Assessment Patient needs continued PT services  PT Diagnosis Difficulty walking;Acute pain;Generalized weakness   PT Problem List Decreased strength;Decreased activity tolerance;Decreased balance;Decreased mobility;Pain;Decreased safety awareness;Decreased knowledge of use of DME  PT Treatment Interventions DME instruction;Gait training;Functional mobility training;Therapeutic activities;Therapeutic exercise;Patient/family education;Balance training   PT Goals (Current goals can be found in the Care Plan section) Acute Rehab PT Goals Patient Stated Goal: to be able to move with less pain PT Goal  Formulation: With patient/family Time For Goal Achievement: 08/31/14 Potential to Achieve Goals: Good    Frequency Min 3X/week   Barriers to discharge        Co-evaluation PT/OT/SLP Co-Evaluation/Treatment: Yes Reason for Co-Treatment: For patient/therapist safety PT goals addressed during session: Mobility/safety with mobility OT goals addressed during session: ADL's and self-care (functional mobility)       End of Session   Activity Tolerance: Patient limited by pain Patient left: in bed;with family/visitor present;with call bell/phone within reach Nurse Communication: Mobility status;Patient requests pain meds         Time: 1610-9604 PT Time Calculation (min) (ACUTE ONLY): 24 min   Charges:   PT Evaluation $Initial PT Evaluation Tier I: 1 Procedure     PT G Codes:        Ilda Foil 08/17/2014, 12:16 PM

## 2014-08-18 ENCOUNTER — Inpatient Hospital Stay (HOSPITAL_COMMUNITY): Payer: Medicare Other

## 2014-08-18 ENCOUNTER — Encounter (HOSPITAL_COMMUNITY): Payer: Self-pay | Admitting: Radiology

## 2014-08-18 DIAGNOSIS — S7011XA Contusion of right thigh, initial encounter: Secondary | ICD-10-CM

## 2014-08-18 DIAGNOSIS — S0101XA Laceration without foreign body of scalp, initial encounter: Secondary | ICD-10-CM | POA: Diagnosis present

## 2014-08-18 DIAGNOSIS — S7011XS Contusion of right thigh, sequela: Secondary | ICD-10-CM

## 2014-08-18 DIAGNOSIS — S129XXA Fracture of neck, unspecified, initial encounter: Secondary | ICD-10-CM

## 2014-08-18 DIAGNOSIS — S12110A Anterior displaced Type II dens fracture, initial encounter for closed fracture: Secondary | ICD-10-CM | POA: Diagnosis present

## 2014-08-18 DIAGNOSIS — D62 Acute posthemorrhagic anemia: Secondary | ICD-10-CM | POA: Diagnosis present

## 2014-08-18 DIAGNOSIS — S72141A Displaced intertrochanteric fracture of right femur, initial encounter for closed fracture: Secondary | ICD-10-CM | POA: Diagnosis present

## 2014-08-18 DIAGNOSIS — W19XXXA Unspecified fall, initial encounter: Secondary | ICD-10-CM | POA: Diagnosis present

## 2014-08-18 DIAGNOSIS — S12100B Unspecified displaced fracture of second cervical vertebra, initial encounter for open fracture: Secondary | ICD-10-CM

## 2014-08-18 DIAGNOSIS — G20 Parkinson's disease: Secondary | ICD-10-CM | POA: Diagnosis present

## 2014-08-18 LAB — CBC
HEMATOCRIT: 25.9 % — AB (ref 36.0–46.0)
Hemoglobin: 8.6 g/dL — ABNORMAL LOW (ref 12.0–15.0)
MCH: 28.8 pg (ref 26.0–34.0)
MCHC: 33.2 g/dL (ref 30.0–36.0)
MCV: 86.6 fL (ref 78.0–100.0)
Platelets: 225 10*3/uL (ref 150–400)
RBC: 2.99 MIL/uL — ABNORMAL LOW (ref 3.87–5.11)
RDW: 14.7 % (ref 11.5–15.5)
WBC: 10.4 10*3/uL (ref 4.0–10.5)

## 2014-08-18 LAB — BASIC METABOLIC PANEL
Anion gap: 9 (ref 5–15)
BUN: 29 mg/dL — ABNORMAL HIGH (ref 6–20)
CALCIUM: 7.5 mg/dL — AB (ref 8.9–10.3)
CO2: 21 mmol/L — ABNORMAL LOW (ref 22–32)
CREATININE: 0.95 mg/dL (ref 0.44–1.00)
Chloride: 102 mmol/L (ref 101–111)
GFR calc Af Amer: 58 mL/min — ABNORMAL LOW (ref >60)
GFR, EST NON AFRICAN AMERICAN: 50 mL/min — AB (ref >60)
Glucose, Bld: 119 mg/dL — ABNORMAL HIGH (ref 65–99)
Potassium: 3.9 mmol/L (ref 3.5–5.1)
Sodium: 132 mmol/L — ABNORMAL LOW (ref 135–145)

## 2014-08-18 MED ORDER — DIAZEPAM 5 MG PO TABS
5.0000 mg | ORAL_TABLET | Freq: Once | ORAL | Status: AC
  Administered 2014-08-18: 5 mg via ORAL
  Filled 2014-08-18: qty 1

## 2014-08-18 NOTE — Progress Notes (Signed)
[facility] PROGRESS NOTE  NATILIE KRABBENHOFT ZOX:[id number] DOB: 01/02/1918 DOA: 08/14/2014 PCP: Ignatius Specking., MD HPI/Subjective: 81 yo female PMHx  Peptic ulcer, gastroparesis, Parkinson's disease, CVA,Burning Mouth Syndrome    Hx of fall presented with hip injury. Imaging revealed right hip fracture. Furthering imaging revealed subacute fracture of dens, no known previous neck injury with history of previous fall. Scalp laceration repaired and tetanus updated. Dr. Lajean Saver discussed with Neurosurgeon Dr. Franky Macho who recommended MRI. 8/7 A/O 4, right thigh pain     Assessment/Plan: Right hip fracture s/p fall.  - patient tells me that she was in her assisted living, turn her body to pick something up and fell on the floor. She denies any syncopal or presyncopal episodes, no chest palpitations  - Continue pain control. Management per orthopedics.  C2 cervical spine fracture suspected chronic.  - MRI C-spine.  - Currently in c-collar. - Recommend obtaining MRI once right hip fracture was repaired  Scalp laceration s/p fall.  - Repaired in ED. Will removed stiches 08/20/14.  Anemia/Acute Blood Loss Anemia?/Hematoma right thigh?/Retroperitoneal bleed? -May be partially dilutional, will decrease patient's normal saline to 48ml/hr -Hemoglobin continues to trend down significantly  -Occult blood -On exam patient appears to have a significant right thigh hematoma as well as possible right retroperitoneal bleed. Will obtain CT scan of both areas -Transfuse for hemoglobin <7   Parkinson's disease. - Continue Sinemet.  History of TIA.  - Continue Plavix.  Burning mouth syndrome.  - Continue Tegretol    Code Status: Full Family Communication: Son Disposition Plan: PT/OT recommends SNF   Consultants: Dr.Mark Becky Sax Orthopedic surgery  Dr. Franky Macho (Neurosurgery)   Procedures: 8/3 x-ray hip with pelvis;Comminuted intertrochanteric right hip fracture 8/3 CT C-spine  without contrast;.- chronic displaced and angulated fracture base dens, with associated osseous erosions involving both the dens/remainder of C2, new from 2011. - Chronic encephalomalacia high frontal lobes bilaterally remote infarct vs traumatic injury. - Chronic osseous fusion at C3-C6, w/ diffuse associated degenerative change along cervical and upper thoracic spine. -Chronic grade 2 anterolisthesis of C7 on T1  -Scattered calcification at carotid bifurcations bilaterally. 8/5 Treatment of Rt intertrochanteric fracture with intramedullary implant. CPT [id number]   8/7 MRI C-spine without contrast;-Remote dens fracture. -No acute cervical spine fracture  -Severe degenerative cervical spondylosis w/ severe multilevel disc disease and facet disease.  -Multilevel mild to moderate spinal and foraminal stenosis. -Small cord lesion at C5? Possible small contusion, myelomalacia or remote area of ischemia.  Cultures   Antibiotics: Ancef 8/53 doses   DVT prophylaxis Lovenox    Objective: Filed Vitals:   08/17/14 2225 08/18/14 0653 08/18/14 1310 08/18/14 2000  BP: 103/47 124/49 118/48 146/64  Pulse: 96 94 92 100  Temp: 98.3 F (36.8 C) 99.1 F (37.3 C) 98.9 F (37.2 C) 100.7 F (38.2 C)  TempSrc: Oral Oral  Oral  Resp: 16 16 16 16   Height:      Weight:      SpO2: 95% 93% 94% 94%    Intake/Output Summary (Last 24 hours) at 08/19/14 0707 Last data filed at 08/18/14 1849  Gross per 24 hour  Intake 1366.25 ml  Output      0 ml  Net 1366.25 ml   Filed Weights   08/14/14 1953 08/14/14 1958  Weight: 54.432 kg (120 lb) 54.432 kg (120 lb)     Exam: General: A/O 4, NAD, No acute respiratory distress, staples 4 right scalp with negative sign hematoma  Eyes: Negative headache, eye  pain, double vision,negative scleral hemorrhage ENT: Negative Runny nose, negative ear pain, negative tinnitus, negative gingival bleeding Neck:  Negative scars, masses, torticollis, lymphadenopathy,  JVD Lungs: Clear to auscultation bilaterally without wheezes or crackles Cardiovascular: Regular rate and rhythm without murmur gallop or rub normal S1 and S2 Abdomen:negative abdominal pain, negative dysphagia, nondistended, positive soft, bowel sounds, no rebound, no ascites, no appreciable mass, right CVA tenderness Extremities: No significant cyanosis, clubbing, or edema bilateral lower extremities, right hip positive hematoma; thigh is tight mottled color painful to touch, surgical incision sites covered and clean, negative sign of infection. Psychiatric:  Negative depression, negative anxiety, negative fatigue, negative mania  Neurologic:  Cranial nerves II through XII intact, tongue/uvula midline, all extremities muscle strength 5/5, sensation intact throughout, finger nose finger bilateral within normal limits, quick finger touch bilateral within normal limits, negative dysarthria, negative expressive aphasia, negative receptive aphasia.     Data Reviewed: Basic Metabolic Panel:  Recent Labs Lab 08/14/14 2042 08/15/14 0539 08/16/14 1907 08/17/14 0400 08/18/14 0650  NA 134* 133*  --  132* 132*  K 4.4 4.1  --  3.7 3.9  CL 98* 98*  --  102 102  CO2 26 28  --  22 21*  GLUCOSE 126* 101*  --  114* 119*  BUN 27* 24*  --  20 29*  CREATININE 1.01* 0.84 0.93 0.99 0.95  CALCIUM 8.5* 8.2*  --  7.6* 7.5*   Liver Function Tests: No results for input(s): AST, ALT, ALKPHOS, BILITOT, PROT, ALBUMIN in the last 168 hours. No results for input(s): LIPASE, AMYLASE in the last 168 hours. No results for input(s): AMMONIA in the last 168 hours. CBC:  Recent Labs Lab 08/14/14 2042  08/15/14 2318 08/16/14 1907 08/17/14 0400 08/18/14 0650 08/19/14 0555  WBC 13.8*  < > 11.6* 14.4* 12.3* 10.4 7.6  NEUTROABS 11.0*  --  9.4*  --   --   --   --   HGB 10.8*  < > 11.7* 11.8* 9.7* 8.6* 8.5*  HCT 33.2*  < > 33.4* 34.8* 29.1* 25.9* 25.4*  MCV 86.5  < > 85.4 87.9 86.9 86.6 86.1  PLT 378  < > 281  249 238 225 246  < > = values in this interval not displayed. Cardiac Enzymes: No results for input(s): CKTOTAL, CKMB, CKMBINDEX, TROPONINI in the last 168 hours. BNP (last 3 results) No results for input(s): BNP in the last 8760 hours.  ProBNP (last 3 results) No results for input(s): PROBNP in the last 8760 hours.  CBG: No results for input(s): GLUCAP in the last 168 hours.  Recent Results (from the past 240 hour(s))  MRSA PCR Screening     Status: None   Collection Time: 08/15/14  2:20 AM  Result Value Ref Range Status   MRSA by PCR NEGATIVE NEGATIVE Final    Comment:        The GeneXpert MRSA Assay (FDA approved for NASAL specimens only), is one component of a comprehensive MRSA colonization surveillance program. It is not intended to diagnose MRSA infection nor to guide or monitor treatment for MRSA infections.   Surgical pcr screen     Status: None   Collection Time: 08/16/14  5:24 AM  Result Value Ref Range Status   MRSA, PCR NEGATIVE NEGATIVE Final   Staphylococcus aureus NEGATIVE NEGATIVE Final    Comment:        The Xpert SA Assay (FDA approved for NASAL specimens in patients over 16 years of age), is  one component of a comprehensive surveillance program.  Test performance has been validated by [facility] for patients greater than or equal to 4 year old. It is not intended to diagnose infection nor to guide or monitor treatment.      Studies: Ct Abdomen Pelvis Wo Contrast  08/18/2014   CLINICAL DATA:  Fall, status post right femur repair, drop in hemoglobin from 11 to 8.6 and 48 hours. Evaluate for hematoma.  EXAM: CT ABDOMEN AND PELVIS WITHOUT CONTRAST  TECHNIQUE: Multidetector CT imaging of the abdomen and pelvis was performed following the standard protocol without IV contrast.  COMPARISON:  04/14/2009  FINDINGS: Lower chest: Calcified granuloma at the medial left lung base. Trace bilateral pleural effusions. Mild left basilar opacity, likely  atelectasis.  Hepatobiliary: Unenhanced liver is unremarkable.  Gallbladder is unremarkable. No intrahepatic or extrahepatic ductal dilatation.  Pancreas: Grossly unremarkable.  Spleen: Within normal limits.  Adrenals/Urinary Tract: Adrenal glands are unremarkable.  Kidneys are unremarkable.  No renal, ureteral, or bladder calculi.  No hydronephrosis.  Bladder is within normal limits.  Stomach/Bowel: Stomach is within normal limits.  No evidence of bowel obstruction.  Appendix is not discretely visualized and is reportedly surgically absent.  Moderate colonic stool burden.  Vascular/Lymphatic: Atherosclerotic calcifications of the abdominal aorta and branch vessels.  No gross abdominopelvic lymphadenopathy.  Reproductive: Uterus is grossly unremarkable.  No adnexal masses.  Other: No pelvic ascites/hematoma.  No evidence of retroperitoneal/intra-abdominal hemorrhage.  Musculoskeletal: Grade 2 spondylolisthesis at L5-S1, chronic.  Degenerative changes of the visualized thoracolumbar spine.  Status post ORIF of the right proximal femur. Associated fluid/gas in the surgical bed (series 201/ image 56).  2.9 x 2.4 cm subcutaneous hematoma along the lateral aspect of the proximal upper thigh/flank (series 201/image 58). Additional mild hemorrhage anterior to the medial gluteus maximus muscle (series 201/image 23).  Additional mild stranding/subcutaneous bruising along the lateral right flank (series 201/image 50).  IMPRESSION: Status post ORIF of the right proximal femur.  Associated mild hemorrhage anterior to the gluteus maximus muscle with additional small 2.9 x 2.4 cm hematoma along the lateral aspect of the upper thigh/flank. Overall, this is likely within the range of the normal expected postoperative appearance in the surgical bed.  No evidence of retroperitoneal/intra-abdominal hemorrhage.  Dedicated CT right lower extremity is dictated separately.   Electronically Signed   By: Charline Bills M.D.   On:  08/18/2014 17:34   Mr Cervical Spine Wo Contrast  08/18/2014   CLINICAL DATA:  Larey Seat.  Evaluate known dens fracture.  EXAM: MRI CERVICAL SPINE WITHOUT CONTRAST  TECHNIQUE: Multiplanar, multisequence MR imaging of the cervical spine was performed. No intravenous contrast was administered.  COMPARISON:  CT scan 08/14/2014  FINDINGS: Examination is limited by patient motion despite multiple repeat sequences.  There is a displaced type 2 dens fracture. This appears remote/chronic. There is no significant marrow edema or surrounding inflammation to suggest an acute injury. Suspect small focus of T2 cord signal abnormality at C5 which could be a small contusion, myelomalacia or remote area of ischemia.  There is severe degenerative cervical spondylosis with severe multilevel disc disease and facet disease. No definite acute cervical spine fracture. No findings suspicious for a injury involving the posterior elements or paraspinal muscles.  The axial images are quite limited by patient motion. There is mild to moderate multilevel spinal and foraminal stenosis as demonstrated on the CT scan.  IMPRESSION: Remote appearing dens fracture. No acute cervical spine fracture is identified.  Severe degenerative cervical  spondylosis with severe multilevel disc disease and facet disease. There is multilevel mild to moderate spinal and foraminal stenosis.  Suspect small cord lesion at C5 which could be a small contusion, myelomalacia or remote area of ischemia.   Electronically Signed   By: Rudie Meyer M.D.   On: 08/18/2014 12:31   Ct Femur Right Wo Contrast  08/18/2014   CLINICAL DATA:  Dropping hemoglobin and hematocrit following right hip surgery.  EXAM: CT OF THE RIGHT FEMUR WITHOUT CONTRAST  TECHNIQUE: Multidetector CT imaging was performed according to the standard protocol. Multiplanar CT image reconstructions were also generated.  COMPARISON:  Radiographs 08/16/2014  FINDINGS: There is a gamma nail and interlocking  dynamic hip screws transfixing the intertrochanteric fracture. No complicating features are demonstrated.  There is a small subcutaneous hematoma overlying the right hip area. There is also hematoma between the piriformis and gluteus maximus muscles. No significant intrapelvic hematoma.  IMPRESSION: Expected postoperative changes from a right hip fracture fixation. No complicating features with the hardware.  Subcutaneous and intramuscular hematomas involving the right hip area.   Electronically Signed   By: Rudie Meyer M.D.   On: 08/18/2014 17:30    Scheduled Meds: . antiseptic oral rinse  7 mL Mouth Rinse BID  . carbamazepine  100 mg Oral BID  . carbidopa-levodopa  1 tablet Oral QID  . enoxaparin (LOVENOX) injection  30 mg Subcutaneous Q24H  . fesoterodine  4 mg Oral Daily  . pantoprazole  40 mg Oral Daily  . polyethylene glycol  17 g Oral BID   Continuous Infusions: . sodium chloride 75 mL/hr at 08/18/14 1849  . lactated ringers 10 mL/hr at 08/16/14 1321    Principal Problem:   Intertrochanteric fracture of right hip Active Problems:   GERD (gastroesophageal reflux disease)   C2 cervical fracture   Dens fracture   Fall   Fracture, intertrochanteric, right femur   Parkinson's disease   Acute blood loss anemia   Scalp laceration   Cervical spine fracture   Hematoma of right thigh   Hematoma of thigh   Burning mouth syndrome    Time spent: 36 min    WOODS, CURTIS J  [facility] Pager [phone]. If 7PM-7AM, please contact night-coverage at [email or web address], password [facility] 08/19/2014, 7:07 AM  LOS: 5 days    Care during the described time interval was provided by me .  I have reviewed this patient's available data, including medical history, events of note, physical examination, and all test results as part of my evaluation. I have personally reviewed and interpreted all radiology studies.   Carolyne Littles, MD [phone] Pager
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
## 2014-08-18 NOTE — Progress Notes (Signed)
Subjective: 2 Days Post-Op Procedure(s) (LRB): RIGHT INTRAMEDULLARY (IM) NAIL INTERTROCHANTERIC HIP FRACTURE (Right) Patient reports pain as mild.   "  I want this collar off "  Objective: Vital signs in last 24 hours: Temp:  [98.3 F (36.8 C)-99.1 F (37.3 C)] 99.1 F (37.3 C) (08/07 0653) Pulse Rate:  [82-96] 94 (08/07 0653) Resp:  [16] 16 (08/07 0653) BP: (103-124)/(47-56) 124/49 mmHg (08/07 0653) SpO2:  [93 %-97 %] 93 % (08/07 0653)  Intake/Output from previous day: 08/06 0701 - 08/07 0700 In: 360 [P.O.:360] Out: -  Intake/Output this shift:     Recent Labs  08/15/14 2318 08/16/14 1907 08/17/14 0400 08/18/14 0650  HGB 11.7* 11.8* 9.7* 8.6*    Recent Labs  08/17/14 0400 08/18/14 0650  WBC 12.3* 10.4  RBC 3.35* 2.99*  HCT 29.1* 25.9*  PLT 238 225    Recent Labs  08/17/14 0400 08/18/14 0650  NA 132* 132*  K 3.7 3.9  CL 102 102  CO2 22 21*  BUN 20 29*  CREATININE 0.99 0.95  GLUCOSE 114* 119*  CALCIUM 7.6* 7.5*   No results for input(s): LABPT, INR in the last 72 hours.  Neurologically intact  Assessment/Plan: 2 Days Post-Op Procedure(s) (LRB): RIGHT INTRAMEDULLARY (IM) NAIL INTERTROCHANTERIC HIP FRACTURE (Right) Up with therapy  SNF, will get MRI to eval C2 base which was question old vs new. Angulation of base was new from 2011.   Wynne Rozak C 08/18/2014, 10:06 AM

## 2014-08-19 ENCOUNTER — Inpatient Hospital Stay (HOSPITAL_COMMUNITY): Payer: Medicare Other

## 2014-08-19 ENCOUNTER — Encounter (HOSPITAL_COMMUNITY): Payer: Self-pay | Admitting: Orthopaedic Surgery

## 2014-08-19 DIAGNOSIS — S7011XA Contusion of right thigh, initial encounter: Secondary | ICD-10-CM | POA: Diagnosis present

## 2014-08-19 DIAGNOSIS — S7010XA Contusion of unspecified thigh, initial encounter: Secondary | ICD-10-CM | POA: Diagnosis present

## 2014-08-19 DIAGNOSIS — S129XXA Fracture of neck, unspecified, initial encounter: Secondary | ICD-10-CM | POA: Diagnosis present

## 2014-08-19 DIAGNOSIS — K146 Glossodynia: Secondary | ICD-10-CM | POA: Diagnosis present

## 2014-08-19 LAB — TYPE AND SCREEN
ABO/RH(D): A POS
Antibody Screen: NEGATIVE
UNIT DIVISION: 0
UNIT DIVISION: 0
UNIT DIVISION: 0
UNIT DIVISION: 0

## 2014-08-19 LAB — CBC
HEMATOCRIT: 25.4 % — AB (ref 36.0–46.0)
Hemoglobin: 8.5 g/dL — ABNORMAL LOW (ref 12.0–15.0)
MCH: 28.8 pg (ref 26.0–34.0)
MCHC: 33.5 g/dL (ref 30.0–36.0)
MCV: 86.1 fL (ref 78.0–100.0)
PLATELETS: 246 10*3/uL (ref 150–400)
RBC: 2.95 MIL/uL — ABNORMAL LOW (ref 3.87–5.11)
RDW: 14.7 % (ref 11.5–15.5)
WBC: 7.6 10*3/uL (ref 4.0–10.5)

## 2014-08-19 MED ORDER — FENTANYL CITRATE (PF) 100 MCG/2ML IJ SOLN
INTRAMUSCULAR | Status: AC
  Administered 2014-08-19: 50 ug via INTRAVENOUS
  Filled 2014-08-19: qty 2

## 2014-08-19 MED ORDER — FENTANYL CITRATE (PF) 100 MCG/2ML IJ SOLN
50.0000 ug | Freq: Once | INTRAMUSCULAR | Status: AC
Start: 2014-08-19 — End: 2014-08-19
  Administered 2014-08-19: 50 ug via INTRAVENOUS

## 2014-08-19 MED ORDER — GADOBENATE DIMEGLUMINE 529 MG/ML IV SOLN
10.0000 mL | Freq: Once | INTRAVENOUS | Status: AC
  Administered 2014-08-19: 10 mL via INTRAVENOUS

## 2014-08-19 MED ORDER — GADOBENATE DIMEGLUMINE 529 MG/ML IV SOLN: 10.0000 mL | Freq: Once | INTRAVENOUS | Status: AC | PRN

## 2014-08-19 NOTE — Progress Notes (Signed)
Physical Therapy Treatment Patient Details Name: Michele Goodwin MRN: [id number] DOB: 17-Apr-1917 Today's Date: 08/19/2014    History of Present Illness Pt admitted s/p fall with right hip fx and C2 facture (suspected chronic).  Now s/p right IM nail.    PT Comments    Patient able to transfer to recliner with +2 total A. Patient very limited and grimacing in pain EOB but once in recliner she stated she felt better. Continue to recommend snf for ongoing therapy.   Follow Up Recommendations  SNF;Supervision/Assistance - 24 hour     Equipment Recommendations  Rolling walker with 5" wheels    Recommendations for Other Services       Precautions / Restrictions Precautions Precautions: Fall Required Braces or Orthoses: Cervical Brace Cervical Brace: Hard collar;At all times Restrictions RLE Weight Bearing: Weight bearing as tolerated    Mobility  Bed Mobility Overal bed mobility: +2 for physical assistance       Supine to sit: +2 for physical assistance;Total assist     General bed mobility comments: +2 assist to support trunk and LEs simutaneously while using draw pads to shift hips to/from EOB.  Transfers Overall transfer level: Needs assistance   Transfers: Squat Pivot Transfers     Squat pivot transfers: +2 physical assistance;Total assist     General transfer comment: Total A with use of pad to lift patient up in recliner. Cues and direction given throughout. Patient able to count to three to start transfer.   Ambulation/Gait                 Stairs            Wheelchair Mobility    Modified Rankin (Stroke Patients Only)       Balance                                    Cognition Arousal/Alertness: Awake/alert Behavior During Therapy: WFL for tasks assessed/performed Overall Cognitive Status: Within Functional Limits for tasks assessed                      Exercises      General Comments        Pertinent  Vitals/Pain Pain Assessment: Faces Faces Pain Scale: Hurts whole lot Pain Location: patient stated in her "knees" when we enter the room but stated "felt better" once up in the recliner Pain Intervention(s): Monitored during session;Premedicated before session;Repositioned    Home Living                      Prior Function            PT Goals (current goals can now be found in the care plan section) Progress towards PT goals: Progressing toward goals    Frequency  Min 3X/week    PT Plan Current plan remains appropriate    Co-evaluation             End of Session   Activity Tolerance: Patient limited by pain Patient left: in chair;with call bell/phone within reach     Time: 4098-1191 PT Time Calculation (min) (ACUTE ONLY): 17 min  Charges:  $Therapeutic Activity: 8-22 mins                    G Codes:      Lanny, Donoso 08/19/2014, 10:26 AM 08/19/2014 Robinette, Adline Potter  PTA [phone] pager [phone] office

## 2014-08-19 NOTE — Progress Notes (Signed)
[facility] PROGRESS NOTE  Michele Goodwin UJW:[id number] DOB: 1917/05/14 DOA: 08/14/2014 PCP: Ignatius Specking., MD HPI/Subjective: 81 yo WF PMHx  Peptic ulcer, gastroparesis, Parkinson's disease, CVA,Burning Mouth Syndrome    Hx of fall presented with hip injury. Imaging revealed right hip fracture. Furthering imaging revealed subacute fracture of dens, no known previous neck injury with history of previous fall. Scalp laceration repaired and tetanus updated. Dr. Lajean Saver discussed with Neurosurgeon Dr. Franky Macho who recommended MRI. 8/8  A/O 4, states her neck/shoulder pain improved. Also states does not have a rehabilitation section at [facility] assisted living facility.     Assessment/Plan: Right hip fracture s/p fall.  - patient tells me that she was in her assisted living, turn her body to pick something up and fell on the floor. She denies any syncopal or presyncopal episodes, no chest palpitations  - Continue pain control. Management per orthopedics. -CSW consult placed patient will require SNF, secondary to her assisted living facility not having a rehabilitation section.  C2 cervical spine fracture suspected chronic.  - MRI C-spine.  - Currently in c-collar. - Recommend obtaining MRI once right hip fracture was repaired  Scalp laceration s/p fall.  - Repaired in ED. Will removed stiches 08/20/14.  Anemia/Acute Blood Loss Anemia?/Hematoma right thigh?/Retroperitoneal bleed? -May be partially dilutional, will decrease patient's normal saline to 71ml/hr -Hemoglobin continues to trend down significantly  -CT of abdomen and right femur show multiple hematoma see results below -Hemoglobin loss appears to have stabilized -Transfuse for hemoglobin <7   Parkinson's disease. - Continue Sinemet.  History of TIA.  - Continue Plavix.  Burning mouth syndrome.  - Continue Tegretol    Code Status: Full Family Communication: Son Disposition Plan: PT/OT recommends  SNF   Consultants: Dr.Mark Becky Sax Orthopedic surgery  Dr. Franky Macho (Neurosurgery)   Procedures: 8/3 x-ray hip with pelvis;Comminuted intertrochanteric right hip fracture 8/3 CT C-spine without contrast;.- chronic displaced and angulated fracture base dens, with associated osseous erosions involving both the dens/remainder of C2, new from 2011. - Chronic encephalomalacia high frontal lobes bilaterally remote infarct vs traumatic injury. - Chronic osseous fusion at C3-C6, w/ diffuse associated degenerative change along cervical and upper thoracic spine. -Chronic grade 2 anterolisthesis of C7 on T1  -Scattered calcification at carotid bifurcations bilaterally. 8/5 Treatment of Rt intertrochanteric fracture with intramedullary implant. CPT [phone]   8/7 MRI C-spine without contrast;-Remote dens fracture. -No acute cervical spine fracture  -Severe degenerative cervical spondylosis w/ severe multilevel disc disease and facet disease.  -Multilevel mild to moderate spinal and foraminal stenosis. -Small cord lesion at C5? Possible small contusion, myelomalacia or remote area of ischemia. 8/7 CT abdomen pelvis without contrast;- mild hemorrhage anterior to the gluteus maximus muscle with additional small 2.9 x 2.4 cm hematoma along the lateral aspect of the upper thigh/flank.  -No evidence of retroperitoneal/intra-abdominal hemorrhage. 8/7 CT femur right without contrast;Subcutaneous and intramuscular hematomas involving the right hip area. 8/8 C-spine MRI with contrast;No enhancement of the cord. The observed focus of T2 hyperintensity is felt to represent gliosis/ myelomalacia.   Cultures   Antibiotics: Ancef 8/53 doses   DVT prophylaxis Lovenox    Objective: Filed Vitals:   08/18/14 1310 08/18/14 2000 08/19/14 0707 08/19/14 1500  BP: 118/48 146/64 159/71 149/69  Pulse: 92 100 81 88  Temp: 98.9 F (37.2 C) 100.7 F (38.2 C) 98.6 F (37 C) 97.9 F (36.6 C)  TempSrc:  Oral  Oral Oral  Resp: 16 16 16 16   Height:  Weight:      SpO2: 94% 94% 98% 98%    Intake/Output Summary (Last 24 hours) at 08/19/14 1634 Last data filed at 08/19/14 1500  Gross per 24 hour  Intake 1366.25 ml  Output      0 ml  Net 1366.25 ml   Filed Weights   08/14/14 1953 08/14/14 1958  Weight: 54.432 kg (120 lb) 54.432 kg (120 lb)     Exam: General: A/O 4, NAD, No acute respiratory distress, staples 4 right scalp with negative sign hematoma  Eyes: Negative headache, eye pain, double vision,negative scleral hemorrhage ENT: Negative Runny nose, negative ear pain, negative tinnitus, negative gingival bleeding Neck:  Negative scars, masses, torticollis, lymphadenopathy, JVD Lungs: Clear to auscultation bilaterally without wheezes or crackles Cardiovascular: Regular rate and rhythm without murmur gallop or rub normal S1 and S2 Abdomen:negative abdominal pain, negative dysphagia, nondistended, positive soft, bowel sounds, no rebound, no ascites, no appreciable mass, right CVA tenderness Extremities: No significant cyanosis, clubbing, or edema bilateral lower extremities, right hip positive hematoma; thigh is tight mottled color painful to touch, surgical incision sites covered and clean, negative sign of infection. Psychiatric:  Negative depression, negative anxiety, negative fatigue, negative mania  Neurologic:  Cranial nerves II through XII intact, tongue/uvula midline, all extremities muscle strength 5/5, sensation intact throughout, finger nose finger bilateral within normal limits, quick finger touch bilateral within normal limits, negative dysarthria, negative expressive aphasia, negative receptive aphasia.     Data Reviewed: Basic Metabolic Panel:  Recent Labs Lab 08/14/14 2042 08/15/14 0539 08/16/14 1907 08/17/14 0400 08/18/14 0650  NA 134* 133*  --  132* 132*  K 4.4 4.1  --  3.7 3.9  CL 98* 98*  --  102 102  CO2 26 28  --  22 21*  GLUCOSE 126* 101*  --  114* 119*   BUN 27* 24*  --  20 29*  CREATININE 1.01* 0.84 0.93 0.99 0.95  CALCIUM 8.5* 8.2*  --  7.6* 7.5*   Liver Function Tests: No results for input(s): AST, ALT, ALKPHOS, BILITOT, PROT, ALBUMIN in the last 168 hours. No results for input(s): LIPASE, AMYLASE in the last 168 hours. No results for input(s): AMMONIA in the last 168 hours. CBC:  Recent Labs Lab 08/14/14 2042  08/15/14 2318 08/16/14 1907 08/17/14 0400 08/18/14 0650 08/19/14 0555  WBC 13.8*  < > 11.6* 14.4* 12.3* 10.4 7.6  NEUTROABS 11.0*  --  9.4*  --   --   --   --   HGB 10.8*  < > 11.7* 11.8* 9.7* 8.6* 8.5*  HCT 33.2*  < > 33.4* 34.8* 29.1* 25.9* 25.4*  MCV 86.5  < > 85.4 87.9 86.9 86.6 86.1  PLT 378  < > 281 249 238 225 246  < > = values in this interval not displayed. Cardiac Enzymes: No results for input(s): CKTOTAL, CKMB, CKMBINDEX, TROPONINI in the last 168 hours. BNP (last 3 results) No results for input(s): BNP in the last 8760 hours.  ProBNP (last 3 results) No results for input(s): PROBNP in the last 8760 hours.  CBG: No results for input(s): GLUCAP in the last 168 hours.  Recent Results (from the past 240 hour(s))  MRSA PCR Screening     Status: None   Collection Time: 08/15/14  2:20 AM  Result Value Ref Range Status   MRSA by PCR NEGATIVE NEGATIVE Final    Comment:        The GeneXpert MRSA Assay (FDA approved for NASAL specimens  only), is one component of a comprehensive MRSA colonization surveillance program. It is not intended to diagnose MRSA infection nor to guide or monitor treatment for MRSA infections.   Surgical pcr screen     Status: None   Collection Time: 08/16/14  5:24 AM  Result Value Ref Range Status   MRSA, PCR NEGATIVE NEGATIVE Final   Staphylococcus aureus NEGATIVE NEGATIVE Final    Comment:        The Xpert SA Assay (FDA approved for NASAL specimens in patients over 59 years of age), is one component of a comprehensive surveillance program.  Test performance  has been validated by [facility] for patients greater than or equal to 62 year old. It is not intended to diagnose infection nor to guide or monitor treatment.      Studies: Ct Abdomen Pelvis Wo Contrast  08/18/2014   CLINICAL DATA:  Fall, status post right femur repair, drop in hemoglobin from 11 to 8.6 and 48 hours. Evaluate for hematoma.  EXAM: CT ABDOMEN AND PELVIS WITHOUT CONTRAST  TECHNIQUE: Multidetector CT imaging of the abdomen and pelvis was performed following the standard protocol without IV contrast.  COMPARISON:  04/14/2009  FINDINGS: Lower chest: Calcified granuloma at the medial left lung base. Trace bilateral pleural effusions. Mild left basilar opacity, likely atelectasis.  Hepatobiliary: Unenhanced liver is unremarkable.  Gallbladder is unremarkable. No intrahepatic or extrahepatic ductal dilatation.  Pancreas: Grossly unremarkable.  Spleen: Within normal limits.  Adrenals/Urinary Tract: Adrenal glands are unremarkable.  Kidneys are unremarkable.  No renal, ureteral, or bladder calculi.  No hydronephrosis.  Bladder is within normal limits.  Stomach/Bowel: Stomach is within normal limits.  No evidence of bowel obstruction.  Appendix is not discretely visualized and is reportedly surgically absent.  Moderate colonic stool burden.  Vascular/Lymphatic: Atherosclerotic calcifications of the abdominal aorta and branch vessels.  No gross abdominopelvic lymphadenopathy.  Reproductive: Uterus is grossly unremarkable.  No adnexal masses.  Other: No pelvic ascites/hematoma.  No evidence of retroperitoneal/intra-abdominal hemorrhage.  Musculoskeletal: Grade 2 spondylolisthesis at L5-S1, chronic.  Degenerative changes of the visualized thoracolumbar spine.  Status post ORIF of the right proximal femur. Associated fluid/gas in the surgical bed (series 201/ image 56).  2.9 x 2.4 cm subcutaneous hematoma along the lateral aspect of the proximal upper thigh/flank (series 201/image 58). Additional mild  hemorrhage anterior to the medial gluteus maximus muscle (series 201/image 23).  Additional mild stranding/subcutaneous bruising along the lateral right flank (series 201/image 50).  IMPRESSION: Status post ORIF of the right proximal femur.  Associated mild hemorrhage anterior to the gluteus maximus muscle with additional small 2.9 x 2.4 cm hematoma along the lateral aspect of the upper thigh/flank. Overall, this is likely within the range of the normal expected postoperative appearance in the surgical bed.  No evidence of retroperitoneal/intra-abdominal hemorrhage.  Dedicated CT right lower extremity is dictated separately.   Electronically Signed   By: Charline Bills M.D.   On: 08/18/2014 17:34   Mr Cervical Spine Wo Contrast  08/18/2014   CLINICAL DATA:  Larey Seat.  Evaluate known dens fracture.  EXAM: MRI CERVICAL SPINE WITHOUT CONTRAST  TECHNIQUE: Multiplanar, multisequence MR imaging of the cervical spine was performed. No intravenous contrast was administered.  COMPARISON:  CT scan 08/14/2014  FINDINGS: Examination is limited by patient motion despite multiple repeat sequences.  There is a displaced type 2 dens fracture. This appears remote/chronic. There is no significant marrow edema or surrounding inflammation to suggest an acute injury. Suspect small focus of  T2 cord signal abnormality at C5 which could be a small contusion, myelomalacia or remote area of ischemia.  There is severe degenerative cervical spondylosis with severe multilevel disc disease and facet disease. No definite acute cervical spine fracture. No findings suspicious for a injury involving the posterior elements or paraspinal muscles.  The axial images are quite limited by patient motion. There is mild to moderate multilevel spinal and foraminal stenosis as demonstrated on the CT scan.  IMPRESSION: Remote appearing dens fracture. No acute cervical spine fracture is identified.  Severe degenerative cervical spondylosis with severe  multilevel disc disease and facet disease. There is multilevel mild to moderate spinal and foraminal stenosis.  Suspect small cord lesion at C5 which could be a small contusion, myelomalacia or remote area of ischemia.   Electronically Signed   By: Rudie Meyer M.D.   On: 08/18/2014 12:31   Mr Cervical Spine W Contrast  08/19/2014   CLINICAL DATA:  Patient fell hurting hip and hitting head. Further characterization requested of cord lesion opposite C5 discovered on prior MR.  EXAM: MRI CERVICAL SPINE WITH CONTRAST  TECHNIQUE: Multiplanar and multiecho pulse sequences of the cervical spine, to include the craniocervical junction and cervicothoracic junction, were obtained according to standard protocol with intravenous contrast.  CONTRAST:  10mL MULTIHANCE GADOBENATE DIMEGLUMINE 529 MG/ML IV SOLN  COMPARISON:  Noncontrast CT of the cervical spine 08/18/2014.  FINDINGS: Post infusion, no abnormal enhancement of the cervical cord is seen. The observed punctate focus of T2 hyperintensity is felt represent gliosis/myelomalacia. This is presumably related to longstanding compression.  There is prominent enhancement of the chronic dens fracture and associated ligamentum flavum hypertrophy. This represents pannus/granulation tissue.  IMPRESSION: No enhancement of the cord. The observed focus of T2 hyperintensity is felt to represent gliosis/ myelomalacia.  Prominent enhancement of the chronic dens fracture and associated ligamentum flavum hypertrophy.   Electronically Signed   By: Elsie Stain M.D.   On: 08/19/2014 11:55   Ct Femur Right Wo Contrast  08/18/2014   CLINICAL DATA:  Dropping hemoglobin and hematocrit following right hip surgery.  EXAM: CT OF THE RIGHT FEMUR WITHOUT CONTRAST  TECHNIQUE: Multidetector CT imaging was performed according to the standard protocol. Multiplanar CT image reconstructions were also generated.  COMPARISON:  Radiographs 08/16/2014  FINDINGS: There is a gamma nail and interlocking  dynamic hip screws transfixing the intertrochanteric fracture. No complicating features are demonstrated.  There is a small subcutaneous hematoma overlying the right hip area. There is also hematoma between the piriformis and gluteus maximus muscles. No significant intrapelvic hematoma.  IMPRESSION: Expected postoperative changes from a right hip fracture fixation. No complicating features with the hardware.  Subcutaneous and intramuscular hematomas involving the right hip area.   Electronically Signed   By: Rudie Meyer M.D.   On: 08/18/2014 17:30    Scheduled Meds: . antiseptic oral rinse  7 mL Mouth Rinse BID  . carbamazepine  100 mg Oral BID  . carbidopa-levodopa  1 tablet Oral QID  . enoxaparin (LOVENOX) injection  30 mg Subcutaneous Q24H  . fesoterodine  4 mg Oral Daily  . pantoprazole  40 mg Oral Daily  . polyethylene glycol  17 g Oral BID   Continuous Infusions: . sodium chloride 75 mL/hr (08/19/14 0741)  . lactated ringers 10 mL/hr at 08/16/14 1321    Principal Problem:   Intertrochanteric fracture of right hip Active Problems:   GERD (gastroesophageal reflux disease)   C2 cervical fracture   Dens fracture  Fall   Fracture, intertrochanteric, right femur   Parkinson's disease   Acute blood loss anemia   Scalp laceration   Cervical spine fracture   Hematoma of right thigh   Hematoma of thigh   Burning mouth syndrome    Time spent: 40 min    Lilley Hubble J  [facility] Pager [phone]. If 7PM-7AM, please contact night-coverage at [email or web address], password [facility] 08/19/2014, 4:34 PM  LOS: 5 days    Care during the described time interval was provided by me .  I have reviewed this patient's available data, including medical history, events of note, physical examination, and all test results as part of my evaluation. I have personally reviewed and interpreted all radiology studies.   Carolyne Littles, MD [phone] Pager

## 2014-08-19 NOTE — Care Management Important Message (Signed)
Important Message  Patient Details  Name: Michele Goodwin MRN: [id number] Date of Birth: May 03, 1917   Medicare Important Message Given:  Yes-third notification given    Orson Aloe 08/19/2014, 4:01 PM

## 2014-08-19 NOTE — Progress Notes (Signed)
Patient ID: Michele Goodwin, female   DOB: 11-09-1917, 81 y.o.   MRN: [id number] BP 149/69 mmHg  Pulse 88  Temp(Src) 97.9 F (36.6 C) (Oral)  Resp 16  Ht  (1.575 m)  Wt 54.432 kg (120 lb)  BMI 21.94 kg/m2  SpO2 98% Alert and oriented to person Mri implies fracture is chronic. Have removed collar while in bed. Will obtain flexion, extension views tomorrow. If out of bed will need collar for now.  Moving all extremities

## 2014-08-20 ENCOUNTER — Inpatient Hospital Stay (HOSPITAL_COMMUNITY): Payer: Medicare Other

## 2014-08-20 DIAGNOSIS — K146 Glossodynia: Secondary | ICD-10-CM

## 2014-08-20 DIAGNOSIS — D62 Acute posthemorrhagic anemia: Secondary | ICD-10-CM

## 2014-08-20 DIAGNOSIS — S72141A Displaced intertrochanteric fracture of right femur, initial encounter for closed fracture: Principal | ICD-10-CM

## 2014-08-20 DIAGNOSIS — S12100A Unspecified displaced fracture of second cervical vertebra, initial encounter for closed fracture: Secondary | ICD-10-CM

## 2014-08-20 NOTE — Progress Notes (Signed)
   Subjective:  Patient reports pain as mild.    Objective:   VITALS:   Filed Vitals:   08/19/14 1500 08/19/14 2001 08/19/14 2321 08/20/14 0537  BP: 149/69 150/67 155/87 152/68  Pulse: 88 91 57 77  Temp: 97.9 F (36.6 C) 98.2 F (36.8 C)  98.6 F (37 C)  TempSrc: Oral Oral  Oral  Resp: Height:      Weight:      SpO2: 98% 100%  96%    Neurologically intact Neurovascular intact Sensation intact distally Intact pulses distally Dorsiflexion/Plantar flexion intact Incision: dressing C/D/I and scant drainage No cellulitis present Compartment soft   Lab Results  Component Value Date   WBC 7.6 08/19/2014   HGB 8.5* 08/19/2014   HCT 25.4* 08/19/2014   MCV 86.1 08/19/2014   PLT 246 08/19/2014     Assessment/Plan:  4 Days Post-Op   - Expected postop acute blood loss anemia - will monitor for symptoms - Up with PT/OT - DVT ppx - SCDs, ambulation, lovenox - WBAT operative extremity - Pain control - Discharge planning - stable from ortho standpoint  Cheral Almas 08/20/2014, 7:54 AM [phone]

## 2014-08-20 NOTE — Progress Notes (Signed)
[facility] PROGRESS NOTE  [name]:[id number] DOB: [birth year] DOA: 08/14/2014 PCP: Ignatius Specking., MD HPI/Subjective: 81 yo WF PMHx  Peptic ulcer, gastroparesis, Parkinson's disease, CVA,Burning Mouth Syndrome    Hx of fall presented with hip injury. Imaging revealed right hip fracture. Furthering imaging revealed subacute fracture of dens, no known previous neck injury with history of previous fall. Scalp laceration repaired and tetanus updated. Dr. Lajean Saver discussed with Neurosurgeon Dr. Franky Macho who recommended MRI. 8/8  A/O 4, states her neck/shoulder pain improved. Also states does not have a rehabilitation section at [facility] assisted living facility.     Assessment/Plan: Right hip fracture s/p fall.  - patient tells me that she was in her assisted living, turn her body to pick something up and fell on the floor. She denies any syncopal or presyncopal episodes, no chest palpitations  -snf S/p repair  C2 cervical spine fracture suspected chronic.  - MRI C-spine- chronic - Currently in c-collar. Neurosurgery saw- needs flex/ext  Scalp laceration s/p fall.  - Repaired in ED  ABLA- s/p surgery -Hemoglobin continues to trend down significantly  -CT of abdomen and right femur show multiple hematoma see results below -Hemoglobin loss appears to have stabilized -Transfuse for hemoglobin <7   Parkinson's disease. - Continue Sinemet.  History of TIA.  - Continue Plavix.  Burning mouth syndrome.  - Continue Tegretol    Code Status: Full Family Communication: Son Disposition Plan: SNF- when ok with neurosurgery   Consultants: Dr.Mark Becky Sax Orthopedic surgery  Dr. Franky Macho (Neurosurgery)   Procedures: 8/3 x-ray hip with pelvis;Comminuted intertrochanteric right hip fracture 8/3 CT C-spine without contrast;.- chronic displaced and angulated fracture base dens, with associated osseous erosions involving both the dens/remainder of C2, new from  2011. - Chronic encephalomalacia high frontal lobes bilaterally remote infarct vs traumatic injury. - Chronic osseous fusion at C3-C6, w/ diffuse associated degenerative change along cervical and upper thoracic spine. -Chronic grade 2 anterolisthesis of C7 on T1  -Scattered calcification at carotid bifurcations bilaterally. 8/5 Treatment of Rt intertrochanteric fracture with intramedullary implant. CPT [phone]   8/7 MRI C-spine without contrast;-Remote dens fracture. -No acute cervical spine fracture  -Severe degenerative cervical spondylosis w/ severe multilevel disc disease and facet disease.  -Multilevel mild to moderate spinal and foraminal stenosis. -Small cord lesion at C5? Possible small contusion, myelomalacia or remote area of ischemia. 8/7 CT abdomen pelvis without contrast;- mild hemorrhage anterior to the gluteus maximus muscle with additional small 2.9 x 2.4 cm hematoma along the lateral aspect of the upper thigh/flank.  -No evidence of retroperitoneal/intra-abdominal hemorrhage. 8/7 CT femur right without contrast;Subcutaneous and intramuscular hematomas involving the right hip area. 8/8 C-spine MRI with contrast;No enhancement of the cord. The observed focus of T2 hyperintensity is felt to represent gliosis/ myelomalacia.   Cultures   Antibiotics: Ancef 8/53 doses   DVT prophylaxis Lovenox    Objective: Filed Vitals:   08/19/14 1500 08/19/14 2001 08/19/14 2321 08/20/14 0537  BP: 149/69 150/67 155/87 152/68  Pulse: 88 91 57 77  Temp: 97.9 F (36.6 C) 98.2 F (36.8 C)  98.6 F (37 C)  TempSrc: Oral Oral  Oral  Resp: 16 16  16   Height:      Weight:      SpO2: 98% 100%  96%    Intake/Output Summary (Last 24 hours) at 08/20/14 1143 Last data filed at 08/20/14 0600  Gross per 24 hour  Intake 2878.75 ml  Output      0 ml  Net 2878.75 ml   Filed Weights   08/14/14 1953 08/14/14 1958  Weight: 54.432 kg (120 lb) 54.432 kg (120 lb)     Exam: General:  A/O 4, NAD, No acute respiratory distress, staples 4 right scalp with negative sign hematoma  Lungs: Clear to auscultation bilaterally without wheezes or crackles Cardiovascular: Regular rate and rhythm without murmur gallop or rub normal S1 and S2 Abdomen:negative abdominal pain, negative dysphagia, nondistended, positive soft, bowel sounds, no rebound, no ascites, no appreciable mass, right CVA tenderness Extremities: No significant cyanosis, clubbing, or edema bilateral lower extremities.    Data Reviewed: Basic Metabolic Panel:  Recent Labs Lab 08/14/14 2042 08/15/14 0539 08/16/14 1907 08/17/14 0400 08/18/14 0650  NA 134* 133*  --  132* 132*  K 4.4 4.1  --  3.7 3.9  CL 98* 98*  --  102 102  CO2 26 28  --  22 21*  GLUCOSE 126* 101*  --  114* 119*  BUN 27* 24*  --  20 29*  CREATININE 1.01* 0.84 0.93 0.99 0.95  CALCIUM 8.5* 8.2*  --  7.6* 7.5*   Liver Function Tests: No results for input(s): AST, ALT, ALKPHOS, BILITOT, PROT, ALBUMIN in the last 168 hours. No results for input(s): LIPASE, AMYLASE in the last 168 hours. No results for input(s): AMMONIA in the last 168 hours. CBC:  Recent Labs Lab 08/14/14 2042  08/15/14 2318 08/16/14 1907 08/17/14 0400 08/18/14 0650 08/19/14 0555  WBC 13.8*  < > 11.6* 14.4* 12.3* 10.4 7.6  NEUTROABS 11.0*  --  9.4*  --   --   --   --   HGB 10.8*  < > 11.7* 11.8* 9.7* 8.6* 8.5*  HCT 33.2*  < > 33.4* 34.8* 29.1* 25.9* 25.4*  MCV 86.5  < > 85.4 87.9 86.9 86.6 86.1  PLT 378  < > 281 249 238 225 246  < > = values in this interval not displayed. Cardiac Enzymes: No results for input(s): CKTOTAL, CKMB, CKMBINDEX, TROPONINI in the last 168 hours. BNP (last 3 results) No results for input(s): BNP in the last 8760 hours.  ProBNP (last 3 results) No results for input(s): PROBNP in the last 8760 hours.  CBG: No results for input(s): GLUCAP in the last 168 hours.  Recent Results (from the past 240 hour(s))  MRSA PCR Screening      Status: None   Collection Time: 08/15/14  2:20 AM  Result Value Ref Range Status   MRSA by PCR NEGATIVE NEGATIVE Final    Comment:        The GeneXpert MRSA Assay (FDA approved for NASAL specimens only), is one component of a comprehensive MRSA colonization surveillance program. It is not intended to diagnose MRSA infection nor to guide or monitor treatment for MRSA infections.   Surgical pcr screen     Status: None   Collection Time: 08/16/14  5:24 AM  Result Value Ref Range Status   MRSA, PCR NEGATIVE NEGATIVE Final   Staphylococcus aureus NEGATIVE NEGATIVE Final    Comment:        The Xpert SA Assay (FDA approved for NASAL specimens in patients over 6 years of age), is one component of a comprehensive surveillance program.  Test performance has been validated by [facility] for patients greater than or equal to 45 year old. It is not intended to diagnose infection nor to guide or monitor treatment.      Studies: Ct Abdomen Pelvis Wo Contrast  08/18/2014  CLINICAL DATA:  Fall, status post right femur repair, drop in hemoglobin from 11 to 8.6 and 48 hours. Evaluate for hematoma.  EXAM: CT ABDOMEN AND PELVIS WITHOUT CONTRAST  TECHNIQUE: Multidetector CT imaging of the abdomen and pelvis was performed following the standard protocol without IV contrast.  COMPARISON:  04/14/2009  FINDINGS: Lower chest: Calcified granuloma at the medial left lung base. Trace bilateral pleural effusions. Mild left basilar opacity, likely atelectasis.  Hepatobiliary: Unenhanced liver is unremarkable.  Gallbladder is unremarkable. No intrahepatic or extrahepatic ductal dilatation.  Pancreas: Grossly unremarkable.  Spleen: Within normal limits.  Adrenals/Urinary Tract: Adrenal glands are unremarkable.  Kidneys are unremarkable.  No renal, ureteral, or bladder calculi.  No hydronephrosis.  Bladder is within normal limits.  Stomach/Bowel: Stomach is within normal limits.  No evidence of bowel  obstruction.  Appendix is not discretely visualized and is reportedly surgically absent.  Moderate colonic stool burden.  Vascular/Lymphatic: Atherosclerotic calcifications of the abdominal aorta and branch vessels.  No gross abdominopelvic lymphadenopathy.  Reproductive: Uterus is grossly unremarkable.  No adnexal masses.  Other: No pelvic ascites/hematoma.  No evidence of retroperitoneal/intra-abdominal hemorrhage.  Musculoskeletal: Grade 2 spondylolisthesis at L5-S1, chronic.  Degenerative changes of the visualized thoracolumbar spine.  Status post ORIF of the right proximal femur. Associated fluid/gas in the surgical bed (series 201/ image 56).  2.9 x 2.4 cm subcutaneous hematoma along the lateral aspect of the proximal upper thigh/flank (series 201/image 58). Additional mild hemorrhage anterior to the medial gluteus maximus muscle (series 201/image 23).  Additional mild stranding/subcutaneous bruising along the lateral right flank (series 201/image 50).  IMPRESSION: Status post ORIF of the right proximal femur.  Associated mild hemorrhage anterior to the gluteus maximus muscle with additional small 2.9 x 2.4 cm hematoma along the lateral aspect of the upper thigh/flank. Overall, this is likely within the range of the normal expected postoperative appearance in the surgical bed.  No evidence of retroperitoneal/intra-abdominal hemorrhage.  Dedicated CT right lower extremity is dictated separately.   Electronically Signed   By: Charline Bills M.D.   On: 08/18/2014 17:34   Mr Cervical Spine Wo Contrast  08/18/2014   CLINICAL DATA:  Larey Seat.  Evaluate known dens fracture.  EXAM: MRI CERVICAL SPINE WITHOUT CONTRAST  TECHNIQUE: Multiplanar, multisequence MR imaging of the cervical spine was performed. No intravenous contrast was administered.  COMPARISON:  CT scan 08/14/2014  FINDINGS: Examination is limited by patient motion despite multiple repeat sequences.  There is a displaced type 2 dens fracture. This appears  remote/chronic. There is no significant marrow edema or surrounding inflammation to suggest an acute injury. Suspect small focus of T2 cord signal abnormality at C5 which could be a small contusion, myelomalacia or remote area of ischemia.  There is severe degenerative cervical spondylosis with severe multilevel disc disease and facet disease. No definite acute cervical spine fracture. No findings suspicious for a injury involving the posterior elements or paraspinal muscles.  The axial images are quite limited by patient motion. There is mild to moderate multilevel spinal and foraminal stenosis as demonstrated on the CT scan.  IMPRESSION: Remote appearing dens fracture. No acute cervical spine fracture is identified.  Severe degenerative cervical spondylosis with severe multilevel disc disease and facet disease. There is multilevel mild to moderate spinal and foraminal stenosis.  Suspect small cord lesion at C5 which could be a small contusion, myelomalacia or remote area of ischemia.   Electronically Signed   By: Rudie Meyer M.D.   On: 08/18/2014 12:31  Mr Cervical Spine W Contrast  08/19/2014   CLINICAL DATA:  Patient fell hurting hip and hitting head. Further characterization requested of cord lesion opposite C5 discovered on prior MR.  EXAM: MRI CERVICAL SPINE WITH CONTRAST  TECHNIQUE: Multiplanar and multiecho pulse sequences of the cervical spine, to include the craniocervical junction and cervicothoracic junction, were obtained according to standard protocol with intravenous contrast.  CONTRAST:  10mL MULTIHANCE GADOBENATE DIMEGLUMINE 529 MG/ML IV SOLN  COMPARISON:  Noncontrast CT of the cervical spine 08/18/2014.  FINDINGS: Post infusion, no abnormal enhancement of the cervical cord is seen. The observed punctate focus of T2 hyperintensity is felt represent gliosis/myelomalacia. This is presumably related to longstanding compression.  There is prominent enhancement of the chronic dens fracture and  associated ligamentum flavum hypertrophy. This represents pannus/granulation tissue.  IMPRESSION: No enhancement of the cord. The observed focus of T2 hyperintensity is felt to represent gliosis/ myelomalacia.  Prominent enhancement of the chronic dens fracture and associated ligamentum flavum hypertrophy.   Electronically Signed   By: Elsie Stain M.D.   On: 08/19/2014 11:55   Ct Femur Right Wo Contrast  08/18/2014   CLINICAL DATA:  Dropping hemoglobin and hematocrit following right hip surgery.  EXAM: CT OF THE RIGHT FEMUR WITHOUT CONTRAST  TECHNIQUE: Multidetector CT imaging was performed according to the standard protocol. Multiplanar CT image reconstructions were also generated.  COMPARISON:  Radiographs 08/16/2014  FINDINGS: There is a gamma nail and interlocking dynamic hip screws transfixing the intertrochanteric fracture. No complicating features are demonstrated.  There is a small subcutaneous hematoma overlying the right hip area. There is also hematoma between the piriformis and gluteus maximus muscles. No significant intrapelvic hematoma.  IMPRESSION: Expected postoperative changes from a right hip fracture fixation. No complicating features with the hardware.  Subcutaneous and intramuscular hematomas involving the right hip area.   Electronically Signed   By: Rudie Meyer M.D.   On: 08/18/2014 17:30    Scheduled Meds: . antiseptic oral rinse  7 mL Mouth Rinse BID  . carbamazepine  100 mg Oral BID  . carbidopa-levodopa  1 tablet Oral QID  . enoxaparin (LOVENOX) injection  30 mg Subcutaneous Q24H  . fesoterodine  4 mg Oral Daily  . pantoprazole  40 mg Oral Daily  . polyethylene glycol  17 g Oral BID   Continuous Infusions: . lactated ringers 10 mL/hr at 08/16/14 1321    Principal Problem:   Intertrochanteric fracture of right hip Active Problems:   GERD (gastroesophageal reflux disease)   C2 cervical fracture   Dens fracture   Fall   Fracture, intertrochanteric, right femur    Parkinson's disease   Acute blood loss anemia   Scalp laceration   Cervical spine fracture   Hematoma of right thigh   Hematoma of thigh   Burning mouth syndrome    Time spent: 25 min    Esmeralda Blanford  [facility] Pager [phone]. If 7PM-7AM, please contact night-coverage at [email or web address], password [facility] 08/20/2014, 11:43 AM  LOS: 6 days

## 2014-08-20 NOTE — Progress Notes (Signed)
Occupational Therapy Treatment Patient Details Name: Michele Goodwin MRN: [id number] DOB: [birth year] Today's Date: 08/20/2014    History of present illness Pt admitted s/p fall with right hip fx and C2 facture (suspected chronic).  Now s/p right IM nail.   OT comments  Patient progressing towards goals, continue plan of care for now. Pt found supine in bed with no cervical collar donned, per neurosurgeon note ok for patient to have cervical collar off while supine in bed. Donned collar prior to patient sitting EOB. Patient with incontinence while seated EOB, notified NT for linen change.   Follow Up Recommendations  SNF;Supervision/Assistance - 24 hour    Equipment Recommendations  Other (comment) (TBD next venue of care)    Recommendations for Other Services  None at this time   Precautions / Restrictions Precautions Precautions: Fall Required Braces or Orthoses: Cervical Brace Cervical Brace: Hard collar (when OOB per neurosurgeon note) Restrictions Weight Bearing Restrictions: Yes RLE Weight Bearing: Weight bearing as tolerated    Mobility Bed Mobility Overal bed mobility: +2 for physical assistance Bed Mobility: Supine to Sit;Sit to Supine     Supine to sit: Max assist;HOB elevated Sit to supine: Total assist;+2 for physical assistance   General bed mobility comments: Cues for sequencing, technique, and safety. Encouragement to participate in bed mobility. Donned cervical collar in supine to sit EOB. Doffed cervical collar once back in supine.   Transfers General transfer comment: Did not occur. Xray transport present to take patient off floor.    Balance Overall balance assessment: Needs assistance Sitting-balance support: Feet supported;Bilateral upper extremity supported Sitting balance-Leahy Scale: Poor   ADL Overall ADL's : Needs assistance/impaired General ADL Comments: Recommend ADL at bed level for patient and staff safety. Pt able to tolerate sitting EOB ~10  minutes with 2 sit<>stands incorporated. Pt with minimal complaints of dizziness that subsided with time. Was going to attempt OOB transfer, however xray transport present to take patient off floor.      Cognition   Behavior During Therapy: WFL for tasks assessed/performed Overall Cognitive Status: Within Functional Limits for tasks assessed                 Pertinent Vitals/ Pain       Pain Assessment: Faces Faces Pain Scale: Hurts whole lot Pain Location: right hip Pain Descriptors / Indicators: Grimacing;Guarding;Discomfort;Sore Pain Intervention(s): Limited activity within patient's tolerance;Monitored during session;Repositioned   Frequency Min 2X/week     Progress Toward Goals  OT Goals(current goals can now befound in the care plan section)  Progress towards OT goals: Progressing toward goals     Plan Discharge plan remains appropriate    End of Session Equipment Utilized During Treatment: Cervical collar   Activity Tolerance Patient tolerated treatment well   Patient Left in bed;with call bell/phone within reach;with nursing/sitter in room  Nurse Communication Other (comment) (incontinence and wet linen)     Time: 5621-3086 OT Time Calculation (min): 31 min  Charges: OT General Charges $OT Visit: 1 Procedure OT Treatments $Self Care/Home Management : 23-37 mins  Fiona Coto , MS, OTR/L, CLT Pager: [phone]  08/20/2014, 1:10 PM

## 2014-08-21 ENCOUNTER — Encounter (HOSPITAL_COMMUNITY): Payer: Self-pay | Admitting: Orthopaedic Surgery

## 2014-08-21 MED ORDER — ACETAMINOPHEN 325 MG PO TABS: 650.0000 mg | ORAL_TABLET | Freq: Four times a day (QID) | ORAL | Status: AC | PRN

## 2014-08-21 MED ORDER — BISACODYL 10 MG RE SUPP: 10.0000 mg | Freq: Every day | RECTAL | Status: AC | PRN

## 2014-08-21 NOTE — Progress Notes (Signed)
Patient d/c this afternoon to SNF, assessments remains unchanged. Awaiting on [facility] for transportation.

## 2014-08-21 NOTE — Discharge Planning (Signed)
Patient will discharge today per MD order. Patient will discharge to [facility] RN to call report prior to transportation to: [phone] Transportation: [facility]  CSW sent discharge summary to SNF for review.  Packet is complete.  RN, patient and family aware of discharge plans. Spoke with Darl Pikes to relay [employer]. Family agreeable.  Vickii Penna, LCSWA [phone]  Psychiatric & Orthopedics (5N 1-16) Clinical Social Worker

## 2014-08-21 NOTE — Discharge Summary (Signed)
Physician Discharge Summary  AMAN BATLEY ZOX:[id number] DOB: March 02, 1917 DOA: 08/14/2014  PCP: Ignatius Specking., MD  Admit date: 08/14/2014 Discharge date: 08/21/2014  Time spent: 35 minutes  Recommendations for Outpatient Follow-up:  1. Cbc 1 week  Discharge Diagnoses:  Principal Problem:   Intertrochanteric fracture of right hip Active Problems:   GERD (gastroesophageal reflux disease)   C2 cervical fracture   Dens fracture   Fall   Fracture, intertrochanteric, right femur   Parkinson's disease   Acute blood loss anemia   Scalp laceration   Cervical spine fracture   Hematoma of right thigh   Hematoma of thigh   Burning mouth syndrome   Discharge Condition: improved  Diet recommendation: regular  Filed Weights   08/14/14 1953 08/14/14 1958  Weight: 54.432 kg (120 lb) 54.432 kg (120 lb)     Hospital Course:  Right hip fracture s/p fall.  - patient tells me that she was in her assisted living, turn her body to pick something up and fell on the floor. She denies any syncopal or presyncopal episodes, no chest palpitations  -snf S/p repair   C2 cervical spine fracture suspected chronic.  - MRI C-spine- chronic Neurosurgery saw- do not believe that a cervical fusion is in her best interests. This is a chronic fracture and she has maintained good neurologic status.   Scalp laceration s/p fall.  - Repaired in ED  ABLA- s/p surgery -CT of abdomen and right femur show multiple hematoma see results below -Hemoglobin loss appears to have stabilized -Transfuse for hemoglobin <7   Parkinson's disease. - Continue Sinemet.  History of TIA.  - Continue Plavix.  Burning mouth syndrome.  - Continue Tegretol   Procedures:    Consultations:  NS  ortho  Discharge Exam: Filed Vitals:   08/21/14 0625  BP: 122/68  Pulse: 92  Temp: 98.1 F (36.7 C)  Resp: 16    General: NAD   Discharge Instructions   Discharge Instructions    Diet general     Complete by:  As directed      Increase activity slowly    Complete by:  As directed      Weight bearing as tolerated    Complete by:  As directed           Current Discharge Medication List    START taking these medications   Details  acetaminophen (TYLENOL) 325 MG tablet Take 2 tablets (650 mg total) by mouth every 6 (six) hours as needed for mild pain (or Fever >/= 101).    aspirin EC 325 MG tablet Take 1 tablet (325 mg total) by mouth 2 (two) times daily. Qty: 84 tablet, Refills: 0    bisacodyl (DULCOLAX) 10 MG suppository Place 1 suppository (10 mg total) rectally daily as needed for moderate constipation. Qty: 12 suppository, Refills: 0    !! clopidogrel (PLAVIX) 75 MG tablet Take 1 tablet (75 mg total) by mouth daily. Qty: 14 tablet, Refills: 0    HYDROcodone-acetaminophen (NORCO) 7.5-325 MG per tablet Take 1-2 tablets by mouth every 6 (six) hours as needed for moderate pain. Qty: 90 tablet, Refills: 0     !! - Potential duplicate medications found. Please discuss with provider.    CONTINUE these medications which have NOT CHANGED   Details  beclomethasone (BECONASE-AQ) 42 MCG/SPRAY nasal spray Place 2 sprays into both nostrils daily. Dose is for each nostril.    carbamazepine (TEGRETOL XR) 100 MG 12 hr tablet Take 100 mg by mouth  2 (two) times daily.    carbidopa-levodopa (SINEMET IR) 25-100 MG per tablet Take 1 tablet by mouth 4 (four) times daily.  Refills: 11    !! clopidogrel (PLAVIX) 75 MG tablet Take 75 mg by mouth daily.    fesoterodine (TOVIAZ) 4 MG TB24 Take 4 mg by mouth daily.    meclizine (ANTIVERT) 25 MG tablet Take 25 mg by mouth every 8 (eight) hours as needed for dizziness.     pantoprazole (PROTONIX) 40 MG tablet Take 40 mg by mouth daily.    PROAIR HFA 108 (90 BASE) MCG/ACT inhaler Inhale 1-2 puffs into the lungs every 6 (six) hours as needed for wheezing or shortness of breath.  Refills: 1     !! - Potential duplicate medications found.  Please discuss with provider.    STOP taking these medications     chlorthalidone (HYGROTON) 25 MG tablet      naproxen sodium (ALEVE) 220 MG tablet        Allergies  Allergen Reactions  . Codeine   . Demerol   . Morphine And Related    Follow-up Information    Follow up with Cheral Almas, MD In 2 weeks.   Specialty:  Orthopedic Surgery   Why:  For suture removal, For wound re-check   Contact information:   [address] [place] [address] [phone]       Follow up with VYAS,DHRUV B., MD In 1 week.   Specialty:  Internal Medicine   Contact information:   [address] [place] [address] [phone]        The results of significant diagnostics from this hospitalization (including imaging, microbiology, ancillary and laboratory) are listed below for reference.    Significant Diagnostic Studies: Ct Abdomen Pelvis Wo Contrast  08/18/2014   CLINICAL DATA:  Fall, status post right femur repair, drop in hemoglobin from 11 to 8.6 and 48 hours. Evaluate for hematoma.  EXAM: CT ABDOMEN AND PELVIS WITHOUT CONTRAST  TECHNIQUE: Multidetector CT imaging of the abdomen and pelvis was performed following the standard protocol without IV contrast.  COMPARISON:  04/14/2009  FINDINGS: Lower chest: Calcified granuloma at the medial left lung base. Trace bilateral pleural effusions. Mild left basilar opacity, likely atelectasis.  Hepatobiliary: Unenhanced liver is unremarkable.  Gallbladder is unremarkable. No intrahepatic or extrahepatic ductal dilatation.  Pancreas: Grossly unremarkable.  Spleen: Within normal limits.  Adrenals/Urinary Tract: Adrenal glands are unremarkable.  Kidneys are unremarkable.  No renal, ureteral, or bladder calculi.  No hydronephrosis.  Bladder is within normal limits.  Stomach/Bowel: Stomach is within normal limits.  No evidence of bowel obstruction.  Appendix is not discretely visualized and is reportedly surgically absent.  Moderate colonic stool  burden.  Vascular/Lymphatic: Atherosclerotic calcifications of the abdominal aorta and branch vessels.  No gross abdominopelvic lymphadenopathy.  Reproductive: Uterus is grossly unremarkable.  No adnexal masses.  Other: No pelvic ascites/hematoma.  No evidence of retroperitoneal/intra-abdominal hemorrhage.  Musculoskeletal: Grade 2 spondylolisthesis at L5-S1, chronic.  Degenerative changes of the visualized thoracolumbar spine.  Status post ORIF of the right proximal femur. Associated fluid/gas in the surgical bed (series 201/ image 56).  2.9 x 2.4 cm subcutaneous hematoma along the lateral aspect of the proximal upper thigh/flank (series 201/image 58). Additional mild hemorrhage anterior to the medial gluteus maximus muscle (series 201/image 23).  Additional mild stranding/subcutaneous bruising along the lateral right flank (series 201/image 50).  IMPRESSION: Status post ORIF of the right proximal femur.  Associated mild hemorrhage  anterior to the gluteus maximus muscle with additional small 2.9 x 2.4 cm hematoma along the lateral aspect of the upper thigh/flank. Overall, this is likely within the range of the normal expected postoperative appearance in the surgical bed.  No evidence of retroperitoneal/intra-abdominal hemorrhage.  Dedicated CT right lower extremity is dictated separately.   Electronically Signed   By: Charline Bills M.D.   On: 08/18/2014 17:34   Dg Chest 1 View  08/14/2014   CLINICAL DATA:  Fall tonight with right hip pain.  EXAM: CHEST  1 VIEW  COMPARISON:  08/04/2014  FINDINGS: Lungs are adequately inflated without focal consolidation or effusion. No evidence pneumothorax. Mild stable cardiomegaly. Calcified plaque over the aortic arch. Biphasic curvature of the thoracolumbar spine with degenerative changes throughout the spine. Moderate degenerative change of the shoulders.  IMPRESSION: No active disease.   Electronically Signed   By: Elberta Fortis M.D.   On: 08/14/2014 20:46   Ct Head Wo  Contrast  08/14/2014   CLINICAL DATA:  Status post fall, with concern for head or cervical spine injury. Initial encounter.  EXAM: CT HEAD WITHOUT CONTRAST  CT CERVICAL SPINE WITHOUT CONTRAST  TECHNIQUE: Multidetector CT imaging of the head and cervical spine was performed following the standard protocol without intravenous contrast. Multiplanar CT image reconstructions of the cervical spine were also generated.  COMPARISON:  CT of the head performed 06/27/2014, CT of the cervical spine performed 02/26/2009, and MRI of the brain performed 04/03/2009  FINDINGS: CT HEAD FINDINGS  There is no evidence of acute infarction, mass lesion, or intra- or extra-axial hemorrhage on CT.  Prominence of the ventricles and sulci reflects moderate cortical volume loss. Diffuse periventricular and subcortical white matter change likely reflects small vessel ischemic microangiopathy. Chronic encephalomalacia at the high frontal lobes bilaterally may reflect remote infarct or traumatic injury. Cerebellar atrophy is noted.  The brainstem and fourth ventricle are within normal limits. The basal ganglia are unremarkable in appearance. No mass effect or midline shift is seen.  There is no evidence of fracture; visualized osseous structures are unremarkable in appearance. The orbits are within normal limits. The paranasal sinuses and mastoid air cells are well-aerated. Mild soft tissue swelling is noted overlying the right temporoparietal calvarium.  CT CERVICAL SPINE FINDINGS  There is no evidence of acute fracture or subluxation. There appears to be a chronic displaced and angulated fracture of the base of the dens, with associated osseous erosions involving both the dens and the remainder of C2. This is new from 2011. There is chronic osseous fusion at C3-C6, with surrounding disc osteophyte complexes, and chronic disc space narrowing along the remainder of the cervical and upper thoracic spine. There is grade 2 anterolisthesis of C7 on  T1, reflecting underlying facet disease. Prevertebral soft tissues are within normal limits.  The left mandibular condyle is only minimally visualized on this study, possibly reflecting positioning.  A small calcification is noted at the left thyroid lobe. The visualized lung apices are clear. Scattered calcification is noted at the carotid bifurcations bilaterally.  IMPRESSION: 1. No evidence of traumatic intracranial injury or fracture. 2. No evidence of acute fracture or subluxation along the cervical spine. 3. Apparent chronic displaced and angulated fracture of the base of the dens, with associated osseous erosions involving both the dens and the remainder of C2, new from 2011. 4. Mild soft tissue swelling overlying the right temporal parietal calvarium. 5. Moderate cortical volume loss and diffuse small vessel ischemic microangiopathy. 6. Chronic encephalomalacia  at the high frontal lobes bilaterally may reflect remote infarct or traumatic injury. 7. Chronic osseous fusion at C3-C6, with diffuse associated degenerative change along the cervical and upper thoracic spine. Chronic grade 2 anterolisthesis of C7 on T1 reflects underlying facet disease. 8. Scattered calcification at the carotid bifurcations bilaterally. Carotid ultrasound could be considered for further evaluation, when and as deemed clinically appropriate.   Electronically Signed   By: Roanna Raider M.D.   On: 08/14/2014 21:24   Ct Cervical Spine Wo Contrast  08/14/2014   CLINICAL DATA:  Status post fall, with concern for head or cervical spine injury. Initial encounter.  EXAM: CT HEAD WITHOUT CONTRAST  CT CERVICAL SPINE WITHOUT CONTRAST  TECHNIQUE: Multidetector CT imaging of the head and cervical spine was performed following the standard protocol without intravenous contrast. Multiplanar CT image reconstructions of the cervical spine were also generated.  COMPARISON:  CT of the head performed 06/27/2014, CT of the cervical spine performed  02/26/2009, and MRI of the brain performed 04/03/2009  FINDINGS: CT HEAD FINDINGS  There is no evidence of acute infarction, mass lesion, or intra- or extra-axial hemorrhage on CT.  Prominence of the ventricles and sulci reflects moderate cortical volume loss. Diffuse periventricular and subcortical white matter change likely reflects small vessel ischemic microangiopathy. Chronic encephalomalacia at the high frontal lobes bilaterally may reflect remote infarct or traumatic injury. Cerebellar atrophy is noted.  The brainstem and fourth ventricle are within normal limits. The basal ganglia are unremarkable in appearance. No mass effect or midline shift is seen.  There is no evidence of fracture; visualized osseous structures are unremarkable in appearance. The orbits are within normal limits. The paranasal sinuses and mastoid air cells are well-aerated. Mild soft tissue swelling is noted overlying the right temporoparietal calvarium.  CT CERVICAL SPINE FINDINGS  There is no evidence of acute fracture or subluxation. There appears to be a chronic displaced and angulated fracture of the base of the dens, with associated osseous erosions involving both the dens and the remainder of C2. This is new from 2011. There is chronic osseous fusion at C3-C6, with surrounding disc osteophyte complexes, and chronic disc space narrowing along the remainder of the cervical and upper thoracic spine. There is grade 2 anterolisthesis of C7 on T1, reflecting underlying facet disease. Prevertebral soft tissues are within normal limits.  The left mandibular condyle is only minimally visualized on this study, possibly reflecting positioning.  A small calcification is noted at the left thyroid lobe. The visualized lung apices are clear. Scattered calcification is noted at the carotid bifurcations bilaterally.  IMPRESSION: 1. No evidence of traumatic intracranial injury or fracture. 2. No evidence of acute fracture or subluxation along the  cervical spine. 3. Apparent chronic displaced and angulated fracture of the base of the dens, with associated osseous erosions involving both the dens and the remainder of C2, new from 2011. 4. Mild soft tissue swelling overlying the right temporal parietal calvarium. 5. Moderate cortical volume loss and diffuse small vessel ischemic microangiopathy. 6. Chronic encephalomalacia at the high frontal lobes bilaterally may reflect remote infarct or traumatic injury. 7. Chronic osseous fusion at C3-C6, with diffuse associated degenerative change along the cervical and upper thoracic spine. Chronic grade 2 anterolisthesis of C7 on T1 reflects underlying facet disease. 8. Scattered calcification at the carotid bifurcations bilaterally. Carotid ultrasound could be considered for further evaluation, when and as deemed clinically appropriate.   Electronically Signed   By: Roanna Raider M.D.   On:  08/14/2014 21:24   Mr Cervical Spine Wo Contrast  08/18/2014   CLINICAL DATA:  Larey Seat.  Evaluate known dens fracture.  EXAM: MRI CERVICAL SPINE WITHOUT CONTRAST  TECHNIQUE: Multiplanar, multisequence MR imaging of the cervical spine was performed. No intravenous contrast was administered.  COMPARISON:  CT scan 08/14/2014  FINDINGS: Examination is limited by patient motion despite multiple repeat sequences.  There is a displaced type 2 dens fracture. This appears remote/chronic. There is no significant marrow edema or surrounding inflammation to suggest an acute injury. Suspect small focus of T2 cord signal abnormality at C5 which could be a small contusion, myelomalacia or remote area of ischemia.  There is severe degenerative cervical spondylosis with severe multilevel disc disease and facet disease. No definite acute cervical spine fracture. No findings suspicious for a injury involving the posterior elements or paraspinal muscles.  The axial images are quite limited by patient motion. There is mild to moderate multilevel spinal  and foraminal stenosis as demonstrated on the CT scan.  IMPRESSION: Remote appearing dens fracture. No acute cervical spine fracture is identified.  Severe degenerative cervical spondylosis with severe multilevel disc disease and facet disease. There is multilevel mild to moderate spinal and foraminal stenosis.  Suspect small cord lesion at C5 which could be a small contusion, myelomalacia or remote area of ischemia.   Electronically Signed   By: Rudie Meyer M.D.   On: 08/18/2014 12:31   Mr Cervical Spine W Contrast  08/19/2014   CLINICAL DATA:  Patient fell hurting hip and hitting head. Further characterization requested of cord lesion opposite C5 discovered on prior MR.  EXAM: MRI CERVICAL SPINE WITH CONTRAST  TECHNIQUE: Multiplanar and multiecho pulse sequences of the cervical spine, to include the craniocervical junction and cervicothoracic junction, were obtained according to standard protocol with intravenous contrast.  CONTRAST:  10mL MULTIHANCE GADOBENATE DIMEGLUMINE 529 MG/ML IV SOLN  COMPARISON:  Noncontrast CT of the cervical spine 08/18/2014.  FINDINGS: Post infusion, no abnormal enhancement of the cervical cord is seen. The observed punctate focus of T2 hyperintensity is felt represent gliosis/myelomalacia. This is presumably related to longstanding compression.  There is prominent enhancement of the chronic dens fracture and associated ligamentum flavum hypertrophy. This represents pannus/granulation tissue.  IMPRESSION: No enhancement of the cord. The observed focus of T2 hyperintensity is felt to represent gliosis/ myelomalacia.  Prominent enhancement of the chronic dens fracture and associated ligamentum flavum hypertrophy.   Electronically Signed   By: Elsie Stain M.D.   On: 08/19/2014 11:55   Ct Femur Right Wo Contrast  08/18/2014   CLINICAL DATA:  Dropping hemoglobin and hematocrit following right hip surgery.  EXAM: CT OF THE RIGHT FEMUR WITHOUT CONTRAST  TECHNIQUE: Multidetector CT  imaging was performed according to the standard protocol. Multiplanar CT image reconstructions were also generated.  COMPARISON:  Radiographs 08/16/2014  FINDINGS: There is a gamma nail and interlocking dynamic hip screws transfixing the intertrochanteric fracture. No complicating features are demonstrated.  There is a small subcutaneous hematoma overlying the right hip area. There is also hematoma between the piriformis and gluteus maximus muscles. No significant intrapelvic hematoma.  IMPRESSION: Expected postoperative changes from a right hip fracture fixation. No complicating features with the hardware.  Subcutaneous and intramuscular hematomas involving the right hip area.   Electronically Signed   By: Rudie Meyer M.D.   On: 08/18/2014 17:30   Dg Cerv Spine Flex&ext Only  08/20/2014   CLINICAL DATA:  C2 cervical fracture, with nonunion, subsequent encounter.  EXAM: CERVICAL SPINE - FLEXION AND EXTENSION VIEWS ONLY  COMPARISON:  MRI of August 19, 2014.  FINDINGS: Chronic dens fracture is again noted as described on prior MRI. There appears to be significant retrolisthesis of the dens relative to the main body of C2 on the extension projection, by approximately 7 mm. There appears to be approximately 8 mm of anterolisthesis of C2 on the flexion projection. Severe multilevel degenerative disc disease is noted throughout the cervical spine.  IMPRESSION: Significant anterolisthesis of the dens relative to main body of C2 is seen on flexion projection, with significant retrolisthesis of the dens seen on the extension projection.   Electronically Signed   By: Lupita Raider, M.D.   On: 08/20/2014 14:05   Dg Hip Operative Unilat With Pelvis Right  08/16/2014   CLINICAL DATA:  Right intra medullary nail, intertrochanteric hip fracture.  EXAM: OPERATIVE RIGHT HIP (WITH PELVIS IF PERFORMED) 2 VIEWS  TECHNIQUE: Fluoroscopic spot image(s) were submitted for interpretation post-operatively.  COMPARISON:  08/14/2014   FINDINGS: Status post internal fixation across the right femoral intertrochanteric fracture. Near anatomic alignment. No hardware or bony complicating feature.  IMPRESSION: Internal fixation of the right intertrochanteric fracture. No complicating feature.   Electronically Signed   By: Charlett Nose M.D.   On: 08/16/2014 15:36   Dg Hip Unilat With Pelvis 2-3 Views Right  08/14/2014   CLINICAL DATA:  Fall at nursing facility 10 night. Right hip pain and visible deformity. Initial encounter.  EXAM: DG HIP (WITH OR WITHOUT PELVIS) 2-3V RIGHT  COMPARISON:  None.  FINDINGS: A comminuted intertrochanteric right hip fracture is seen, with medial angulation of the distal fracture fragment. No evidence of hip dislocation. No acetabular or pelvic fracture identified. Generalized osteopenia noted.  IMPRESSION: Comminuted intertrochanteric right hip fracture.   Electronically Signed   By: Myles Rosenthal M.D.   On: 08/14/2014 20:46    Microbiology: Recent Results (from the past 240 hour(s))  MRSA PCR Screening     Status: None   Collection Time: 08/15/14  2:20 AM  Result Value Ref Range Status   MRSA by PCR NEGATIVE NEGATIVE Final    Comment:        The GeneXpert MRSA Assay (FDA approved for NASAL specimens only), is one component of a comprehensive MRSA colonization surveillance program. It is not intended to diagnose MRSA infection nor to guide or monitor treatment for MRSA infections.   Surgical pcr screen     Status: None   Collection Time: 08/16/14  5:24 AM  Result Value Ref Range Status   MRSA, PCR NEGATIVE NEGATIVE Final   Staphylococcus aureus NEGATIVE NEGATIVE Final    Comment:        The Xpert SA Assay (FDA approved for NASAL specimens in patients over 53 years of age), is one component of a comprehensive surveillance program.  Test performance has been validated by [facility] for patients greater than or equal to 64 year old. It is not intended to diagnose infection nor to guide or  monitor treatment.      Labs: Basic Metabolic Panel:  Recent Labs Lab 08/14/14 2042 08/15/14 0539 08/16/14 1907 08/17/14 0400 08/18/14 0650  NA 134* 133*  --  132* 132*  K 4.4 4.1  --  3.7 3.9  CL 98* 98*  --  102 102  CO2 26 28  --  22 21*  GLUCOSE 126* 101*  --  114* 119*  BUN 27* 24*  --  20 29*  CREATININE 1.01* 0.84 0.93 0.99 0.95  CALCIUM 8.5* 8.2*  --  7.6* 7.5*   Liver Function Tests: No results for input(s): AST, ALT, ALKPHOS, BILITOT, PROT, ALBUMIN in the last 168 hours. No results for input(s): LIPASE, AMYLASE in the last 168 hours. No results for input(s): AMMONIA in the last 168 hours. CBC:  Recent Labs Lab 08/14/14 2042  08/15/14 2318 08/16/14 1907 08/17/14 0400 08/18/14 0650 08/19/14 0555  WBC 13.8*  < > 11.6* 14.4* 12.3* 10.4 7.6  NEUTROABS 11.0*  --  9.4*  --   --   --   --   HGB 10.8*  < > 11.7* 11.8* 9.7* 8.6* 8.5*  HCT 33.2*  < > 33.4* 34.8* 29.1* 25.9* 25.4*  MCV 86.5  < > 85.4 87.9 86.9 86.6 86.1  PLT 378  < > 281 249 238 225 246  < > = values in this interval not displayed. Cardiac Enzymes: No results for input(s): CKTOTAL, CKMB, CKMBINDEX, TROPONINI in the last 168 hours. BNP: BNP (last 3 results) No results for input(s): BNP in the last 8760 hours.  ProBNP (last 3 results) No results for input(s): PROBNP in the last 8760 hours.  CBG: No results for input(s): GLUCAP in the last 168 hours.     SignedMarlin Canary  [facility] 08/21/2014, 10:18 AM

## 2014-08-21 NOTE — Clinical Social Work Placement (Signed)
   CLINICAL SOCIAL WORK PLACEMENT  NOTE  Date:  08/21/2014  Patient Details  Name: Michele Goodwin MRN: [id number] Date of Birth: 1917-03-02  Clinical Social Work is seeking post-discharge placement for this patient at the Skilled  Nursing Facility level of care (*CSW will initial, date and re-position this form in  chart as items are completed):  Yes   Patient/family provided with [facility] Social Work Department's list of facilities offering this level of care within the geographic area requested by the patient (or if unable, by the patient's family).  Yes   Patient/family informed of their freedom to choose among providers that offer the needed level of care, that participate in Medicare, Medicaid or managed care program needed by the patient, have an available bed and are willing to accept the patient.  Yes   Patient/family informed of [facility]'s ownership interest in [facility] and [facility], as well as of the fact that they are under no obligation to receive care at these facilities.  PASRR submitted to [employer] on 08/18/14     PASRR number received on 08/18/14     Existing PASRR number confirmed on       FL2 transmitted to all facilities in geographic area requested by pt/family on 08/18/14     FL2 transmitted to all facilities within larger geographic area on       Patient informed that his/her managed care company has contracts with or will negotiate with certain facilities, including the following:        Yes   Patient/family informed of bed offers received.  Patient chooses bed at [facility]     Physician recommends and patient chooses bed at      Patient to be transferred to [facility] on 08/21/14.  Patient to be transferred to facility by [facility]     Patient family notified on 08/21/14 of transfer.  Name of family member notified:  Darl Pikes      PHYSICIAN Please prepare priority discharge summary, including medications      Additional Comment:    _______________________________________________ Rondel Baton, LCSW 08/21/2014, 1:05 PM

## 2014-08-21 NOTE — Progress Notes (Signed)
Patient ID: Michele Goodwin, female   DOB: 06/04/1917, 81 y.o.   MRN: [id number] BP 122/68 mmHg  Pulse 92  Temp(Src) 98.1 F (36.7 C) (Oral)  Resp 16  Ht  (1.575 m)  Wt 54.432 kg (120 lb)  BMI 21.94 kg/m2  SpO2 98% Alert, oriented Moving all extremities She has significant movement on flexion and extension films. Despite this i do not believe that a cervical fusion is in her best interests. This is a chronic fracture and she has maintained good neurologic status.  Therefore she can be discharged, does not need followup.

## 2014-11-12 DEATH — deceased

## 2016-08-15 IMAGING — MR MR CERVICAL SPINE W/O CM
4 of 9 series · 19 of 48 positions shown · non-contrast
Comparison: CT scan 08/14/2014

CLINICAL DATA: Fell.  Evaluate known dens fracture.

EXAM:
MRI CERVICAL SPINE WITHOUT CONTRAST
TECHNIQUE: Multiplanar, multisequence MR imaging of the cervical spine was
performed. No intravenous contrast was administered.

[Series 7: T2 · axial · 3.0mm · 0.35mm/px · z∈[-208,-42]mm · 8 of 51 slices shown (1 of 3)]
[im 1/51]
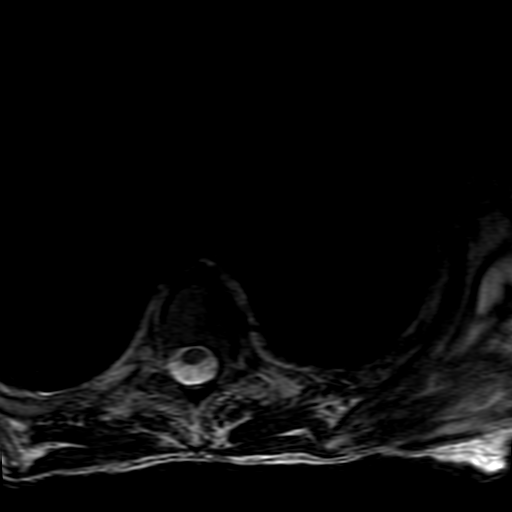
[im 8/51]
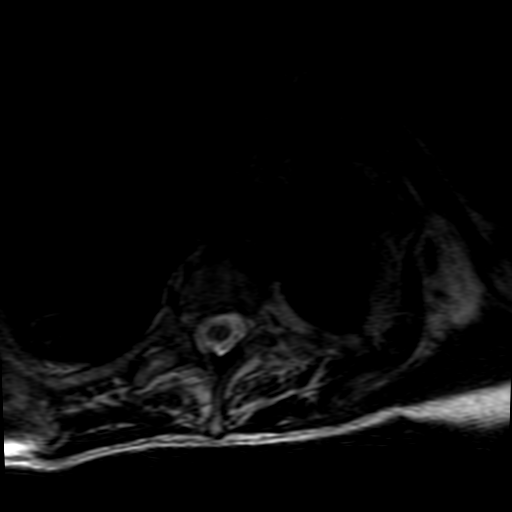
[im 15/51]
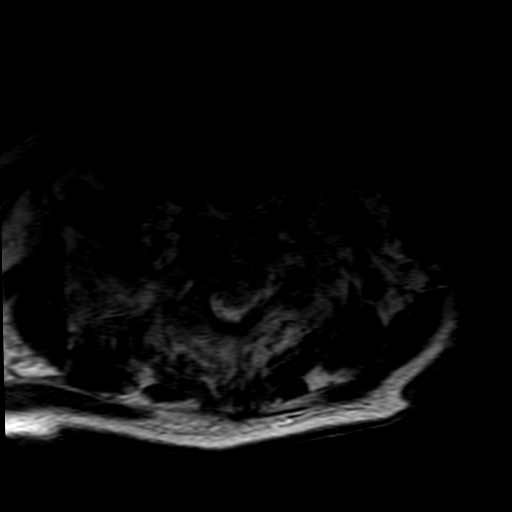
[im 22/51]
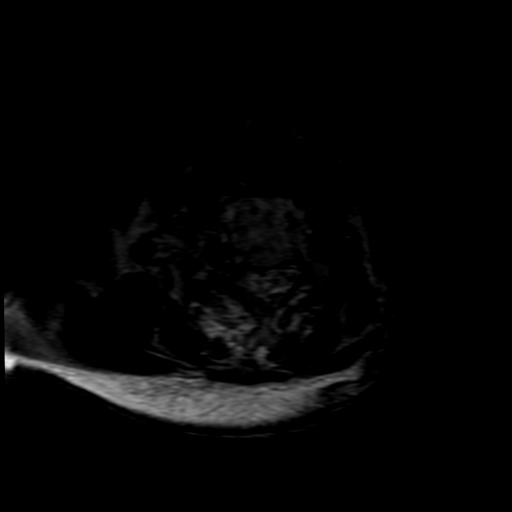
[im 29/51]
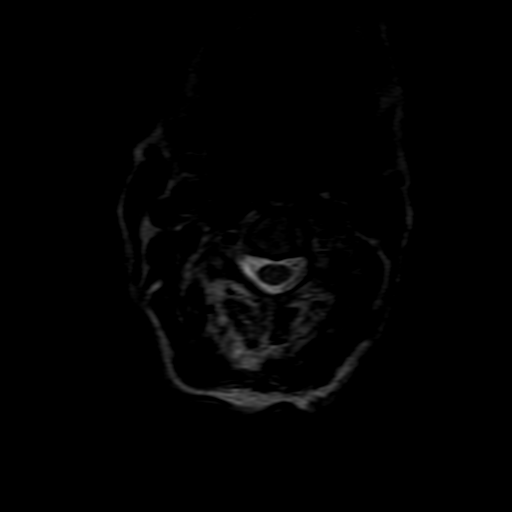
[im 36/51]
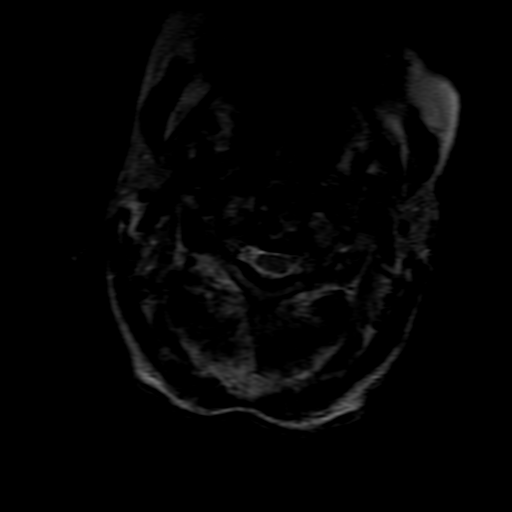
[im 43/51]
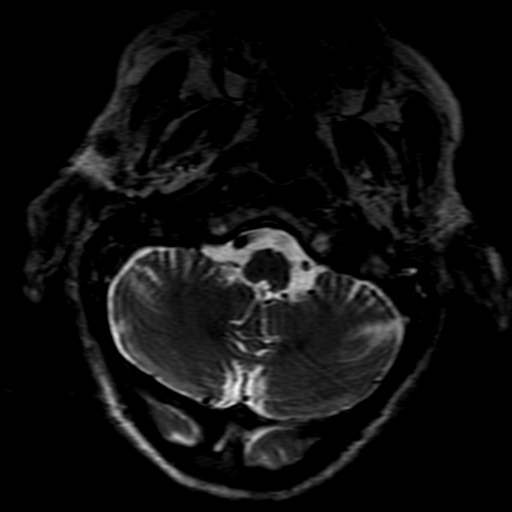
[im 51/51]
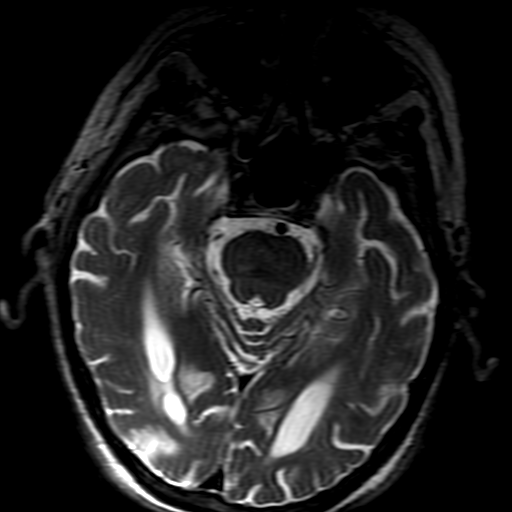

[Series 10: T1 · axial · 3.0mm · 0.35mm/px · z∈[-208,-69]mm · 5 of 51 slices shown]
[im 1/51]
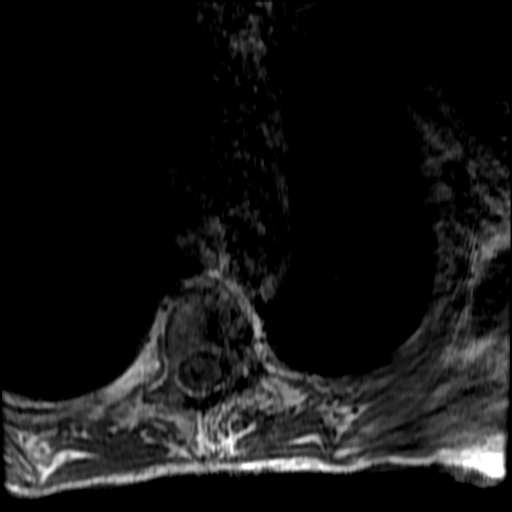
[im 8/51]
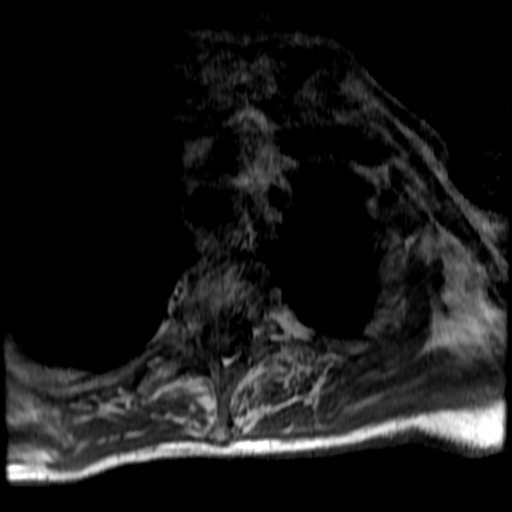
[im 15/51]
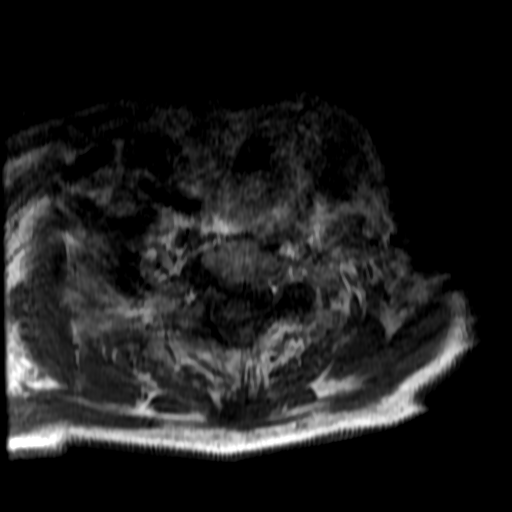
[im 29/51]
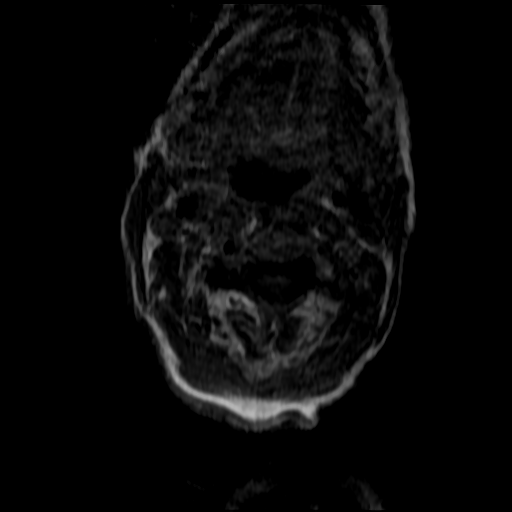
[im 43/51]
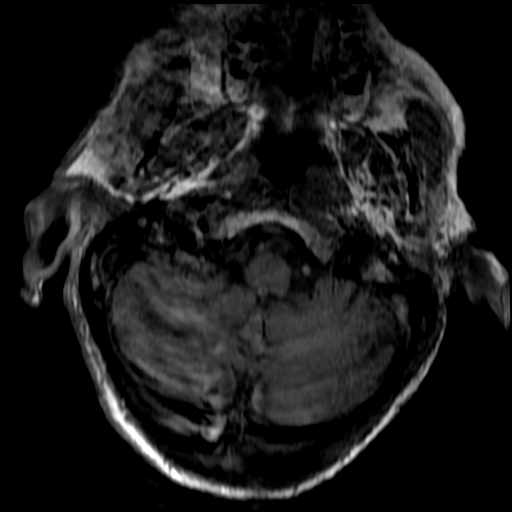

[Series 200: T2 · sagittal · 3.0mm · 0.45mm/px · 3 of 16 slices shown (2 of 3)]
[im 1/16]
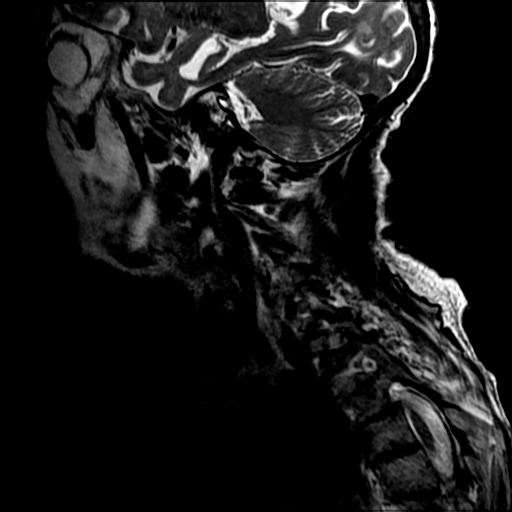
[im 8/16]
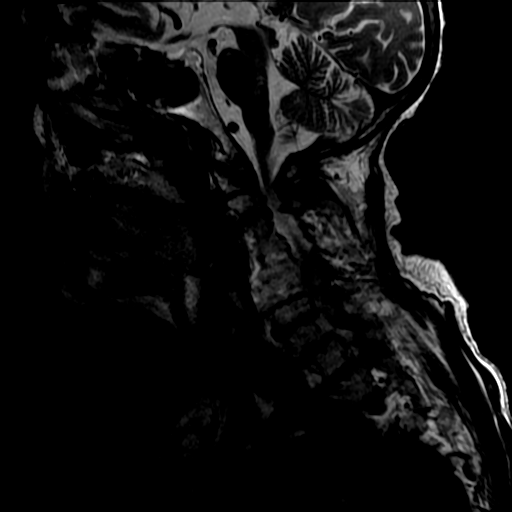
[im 16/16]
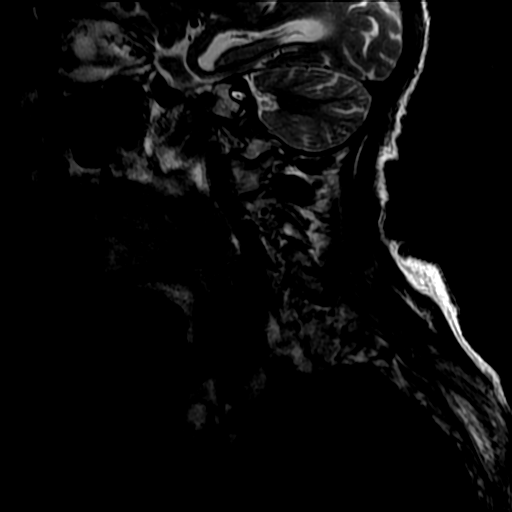

[Series 300: T2 · sagittal · 3.0mm · 0.45mm/px · 3 of 18 slices shown (3 of 3)]
[im 1/18]
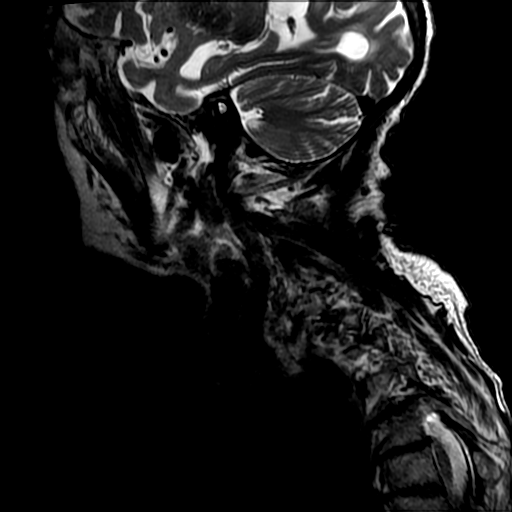
[im 9/18]
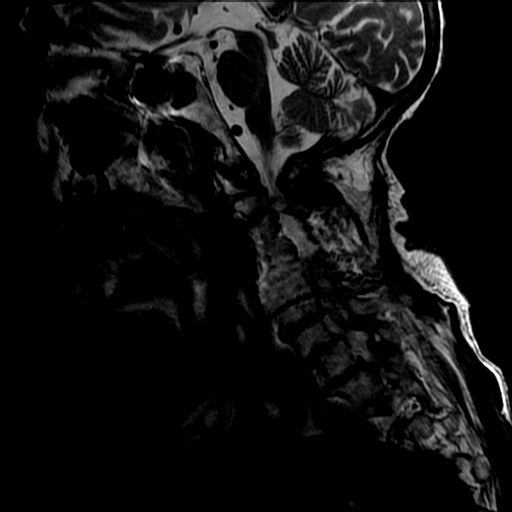
[im 18/18]
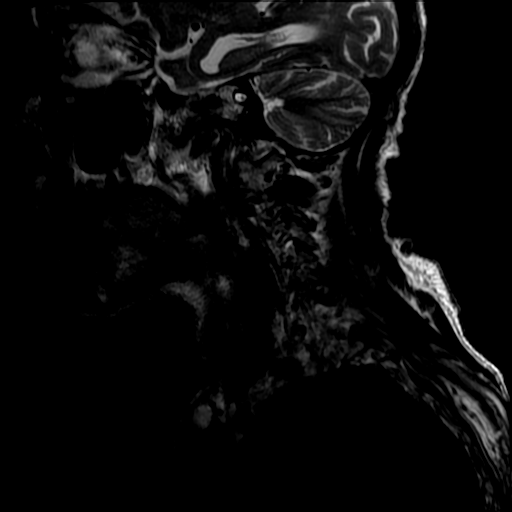

[19 of 48 positions shown; findings below may reference images not displayed]

FINDINGS: Examination is limited by patient motion despite multiple repeat
sequences.

There is a displaced type 2 dens fracture. This appears
remote/chronic. There is no significant marrow edema or surrounding
inflammation to suggest an acute injury. Suspect small focus of T2
cord signal abnormality at C5 which could be a small contusion,
myelomalacia or remote area of ischemia.

There is severe degenerative cervical spondylosis with severe
multilevel disc disease and facet disease. No definite acute
cervical spine fracture. No findings suspicious for a injury
involving the posterior elements or paraspinal muscles.

The axial images are quite limited by patient motion. There is mild
to moderate multilevel spinal and foraminal stenosis as demonstrated
on the CT scan.
IMPRESSION: Remote appearing dens fracture. No acute cervical spine fracture is
identified.

Severe degenerative cervical spondylosis with severe multilevel disc
disease and facet disease. There is multilevel mild to moderate
spinal and foraminal stenosis.

Suspect small cord lesion at C5 which could be a small contusion,
myelomalacia or remote area of ischemia.
